# Patient Record
Sex: Female | Born: 1955 | Race: White | Hispanic: No | Marital: Married | Smoking: Never smoker
Health system: Southern US, Community
[De-identification: ages and names within clinical notes are randomized; demographics above are authoritative.]

## PROBLEM LIST (undated history)

## (undated) DIAGNOSIS — N3281 Overactive bladder: Secondary | ICD-10-CM

## (undated) DIAGNOSIS — Z803 Family history of malignant neoplasm of breast: Secondary | ICD-10-CM

## (undated) DIAGNOSIS — M1990 Unspecified osteoarthritis, unspecified site: Secondary | ICD-10-CM

## (undated) HISTORY — PX: BREAST LUMPECTOMY: SHX2

## (undated) HISTORY — PX: MOUTH SURGERY: SHX715

## (undated) HISTORY — DX: Family history of malignant neoplasm of breast: Z80.3

## (undated) HISTORY — PX: DILATION AND EVACUATION: SHX1459

## (undated) HISTORY — PX: TUBAL LIGATION: SHX77

---

## 1998-02-04 ENCOUNTER — Ambulatory Visit (HOSPITAL_COMMUNITY): Admission: RE | Admit: 1998-02-04 | Discharge: 1998-02-04 | Payer: Self-pay | Admitting: Obstetrics and Gynecology

## 1998-03-02 ENCOUNTER — Inpatient Hospital Stay (HOSPITAL_COMMUNITY): Admission: AD | Admit: 1998-03-02 | Discharge: 1998-03-05 | Payer: Self-pay | Admitting: Obstetrics & Gynecology

## 2000-01-04 ENCOUNTER — Other Ambulatory Visit: Admission: RE | Admit: 2000-01-04 | Discharge: 2000-01-04 | Payer: Self-pay | Admitting: Obstetrics & Gynecology

## 2002-09-24 ENCOUNTER — Other Ambulatory Visit: Admission: RE | Admit: 2002-09-24 | Discharge: 2002-09-24 | Payer: Self-pay | Admitting: Obstetrics & Gynecology

## 2003-09-29 ENCOUNTER — Other Ambulatory Visit: Admission: RE | Admit: 2003-09-29 | Discharge: 2003-09-29 | Payer: Self-pay | Admitting: Obstetrics & Gynecology

## 2004-11-11 ENCOUNTER — Other Ambulatory Visit: Admission: RE | Admit: 2004-11-11 | Discharge: 2004-11-11 | Payer: Self-pay | Admitting: Obstetrics & Gynecology

## 2018-12-12 ENCOUNTER — Other Ambulatory Visit: Payer: Self-pay | Admitting: Obstetrics & Gynecology

## 2018-12-12 DIAGNOSIS — R928 Other abnormal and inconclusive findings on diagnostic imaging of breast: Secondary | ICD-10-CM

## 2018-12-17 ENCOUNTER — Ambulatory Visit
Admission: RE | Admit: 2018-12-17 | Discharge: 2018-12-17 | Disposition: A | Discharge status: Home / Self Care | Payer: BLUE CROSS/BLUE SHIELD | Source: Ambulatory Visit | Attending: Obstetrics & Gynecology | Admitting: Obstetrics & Gynecology

## 2018-12-17 DIAGNOSIS — R928 Other abnormal and inconclusive findings on diagnostic imaging of breast: Secondary | ICD-10-CM

## 2020-01-10 IMAGING — US ULTRASOUND LEFT BREAST LIMITED
1 series · 6 of 6 positions shown · non-contrast
Comparison: Previous exam(s).

CLINICAL DATA: The patient was called back for left breast mass

EXAM:
DIGITAL DIAGNOSTIC LEFT MAMMOGRAM WITH TOMO
ULTRASOUND LEFT BREAST

[Series 1: ultrasound left breast limited · 0.07mm/px · 6 of 6 slices shown]
[im 1/6]
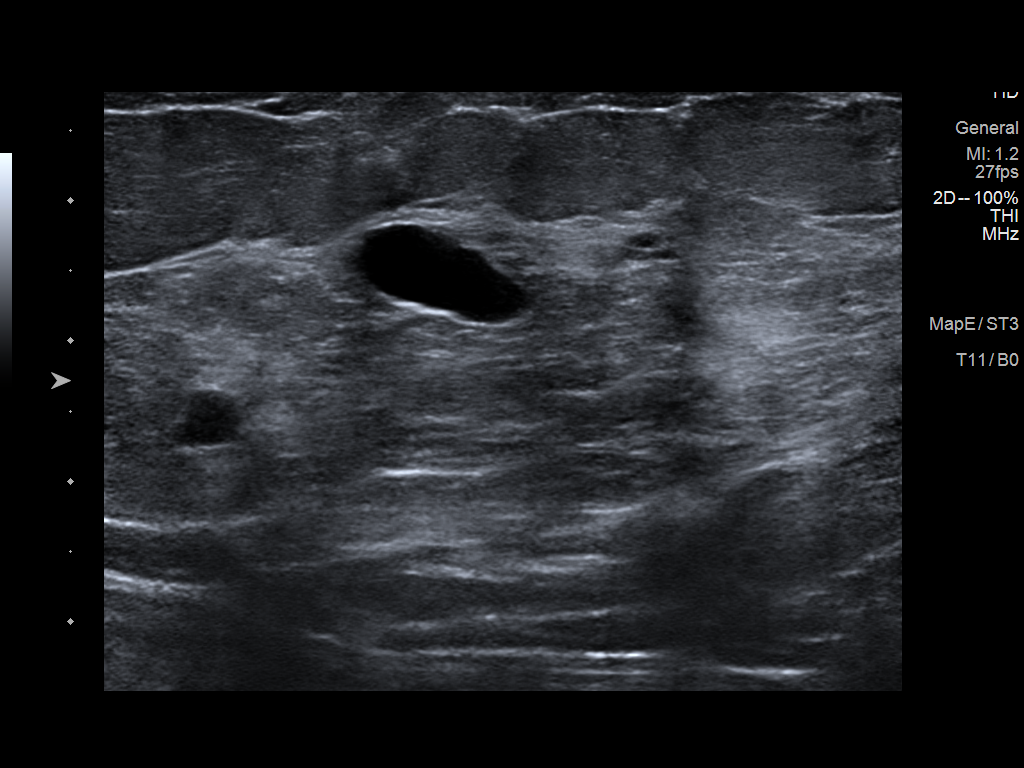
[im 2/6]
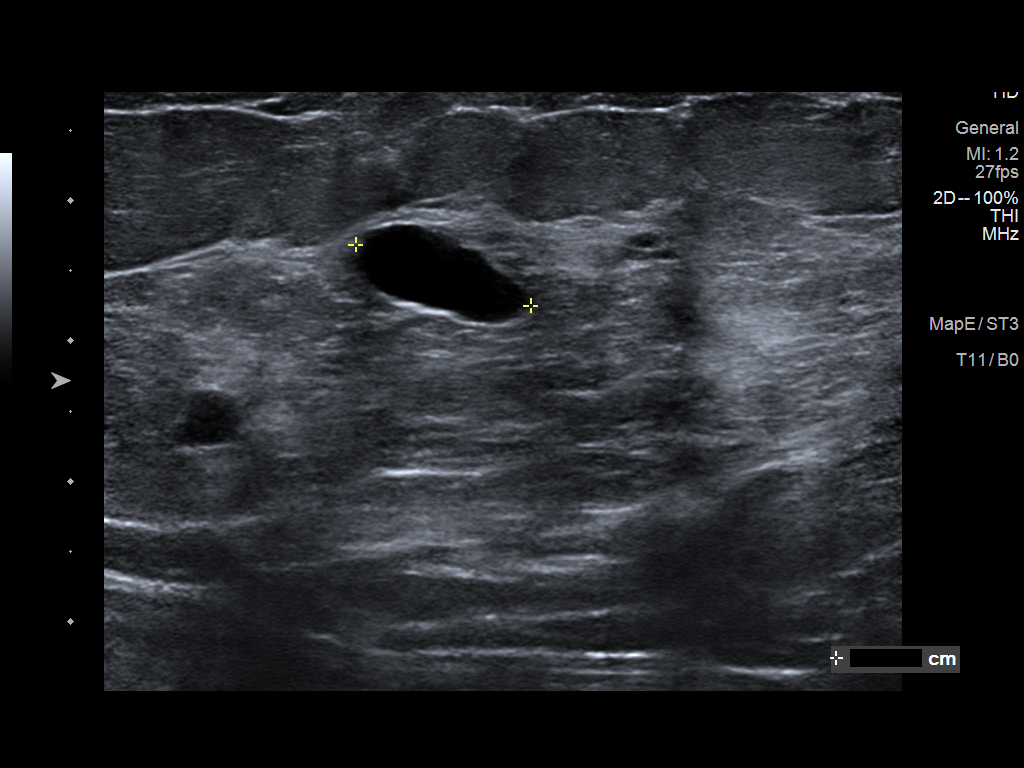
[im 3/6]
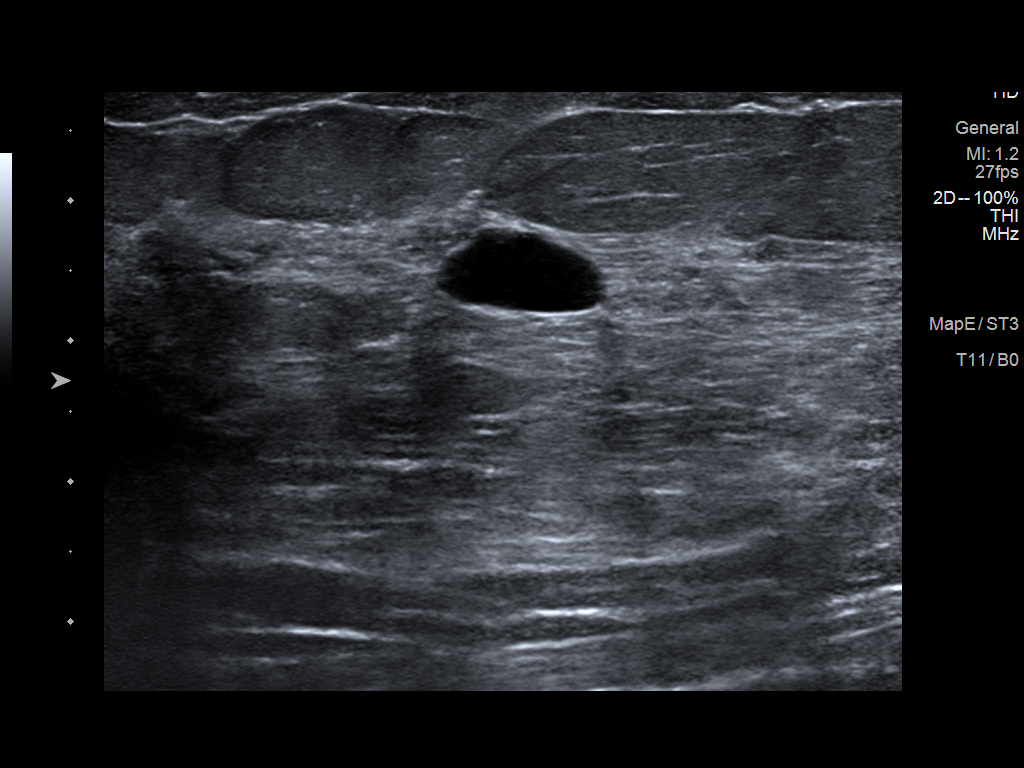
[im 4/6]
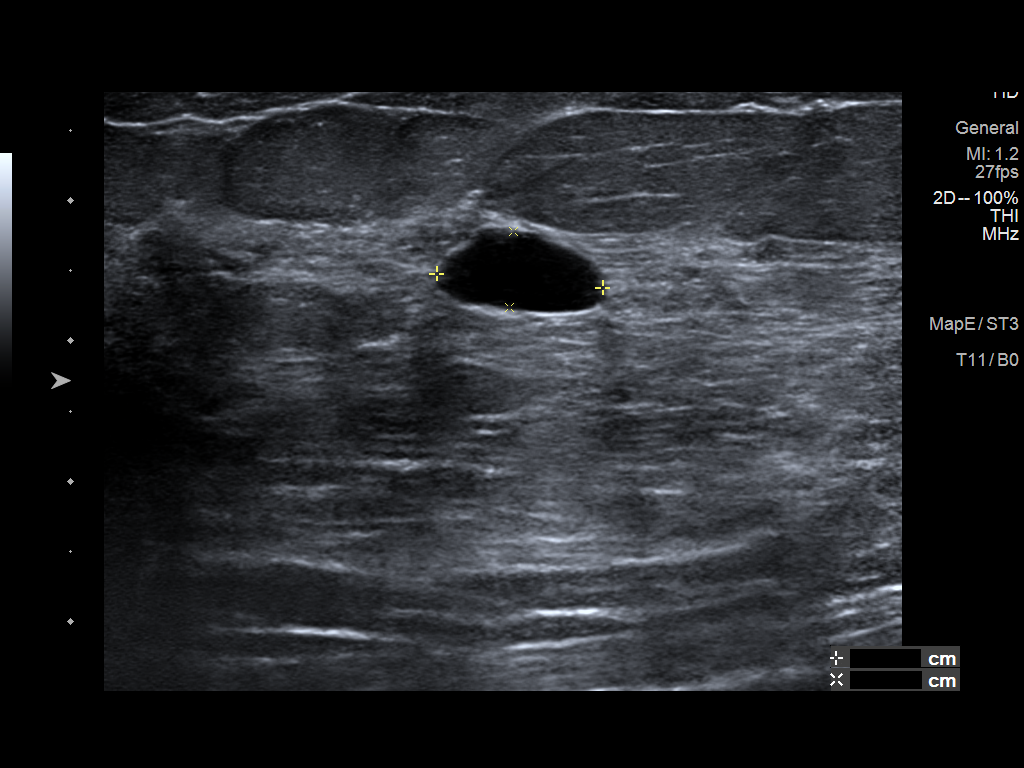
[im 5/6]
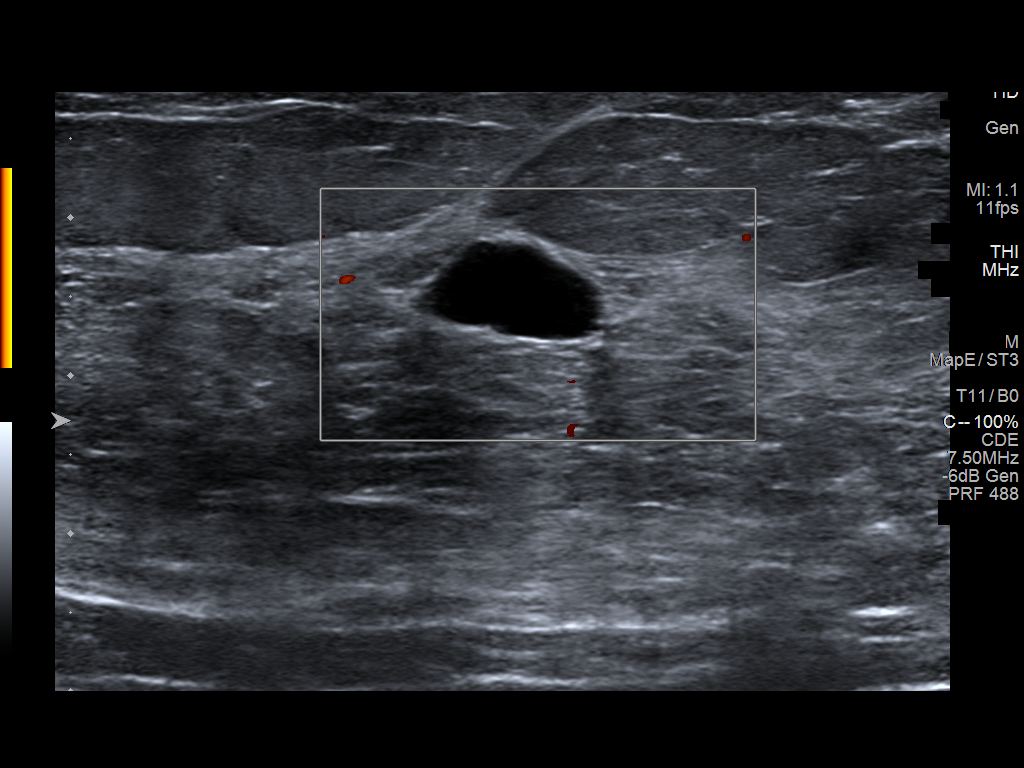
[im 6/6]
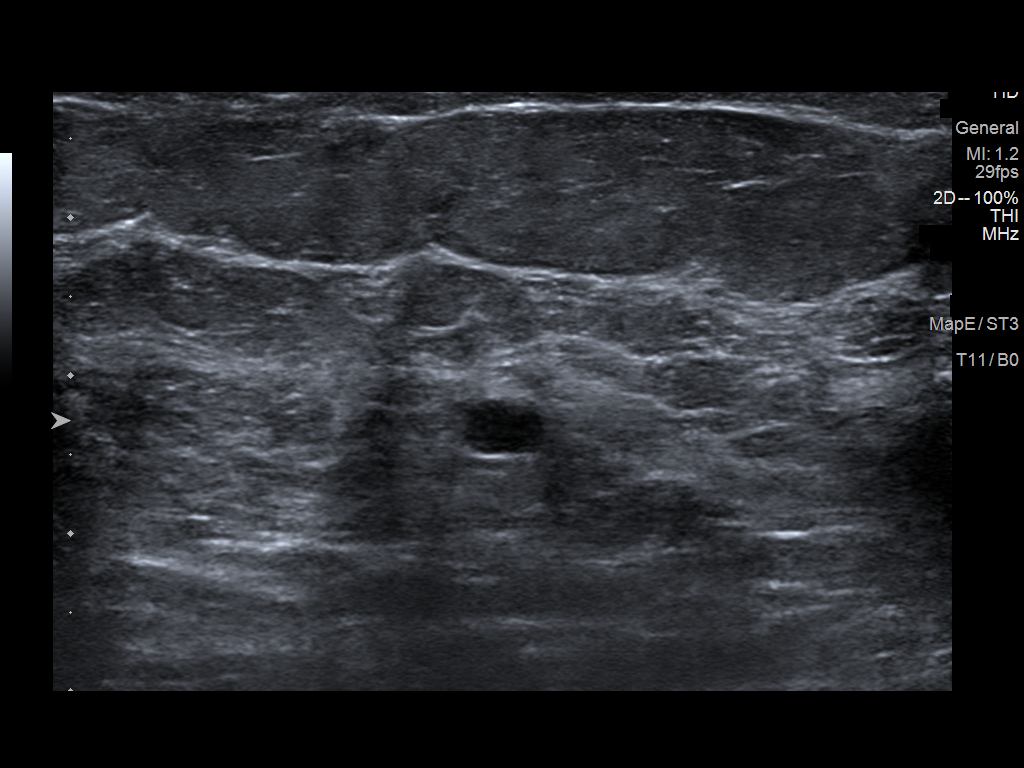

[6 of 6 positions shown; findings below may reference images not displayed]

ACR Breast Density Category b: There are scattered areas of
fibroglandular density.
FINDINGS: The mass in the slightly lateral superior left breast persists on
additional imaging.

On physical exam, no suspicious lumps are identified.

Targeted ultrasound is performed, showing a simple cyst in the upper
outer left breast accounting for the mammographic finding.
IMPRESSION: Fibrocystic changes.  No evidence of malignancy.

RECOMMENDATION:
Annual screening mammography.

I have discussed the findings and recommendations with the patient.
Results were also provided in writing at the conclusion of the
visit. If applicable, a reminder letter will be sent to the patient
regarding the next appointment.

BI-RADS CATEGORY  2: Benign.

## 2020-01-10 IMAGING — MG DIGITAL DIAGNOSTIC UNILATERAL LEFT MAMMOGRAM WITH TOMO AND CAD
4 series · 4 of 12 positions shown · non-contrast
Comparison: Previous exam(s).

CLINICAL DATA: The patient was called back for left breast mass

EXAM:
DIGITAL DIAGNOSTIC LEFT MAMMOGRAM WITH TOMO
ULTRASOUND LEFT BREAST

[L CC synth-2D]
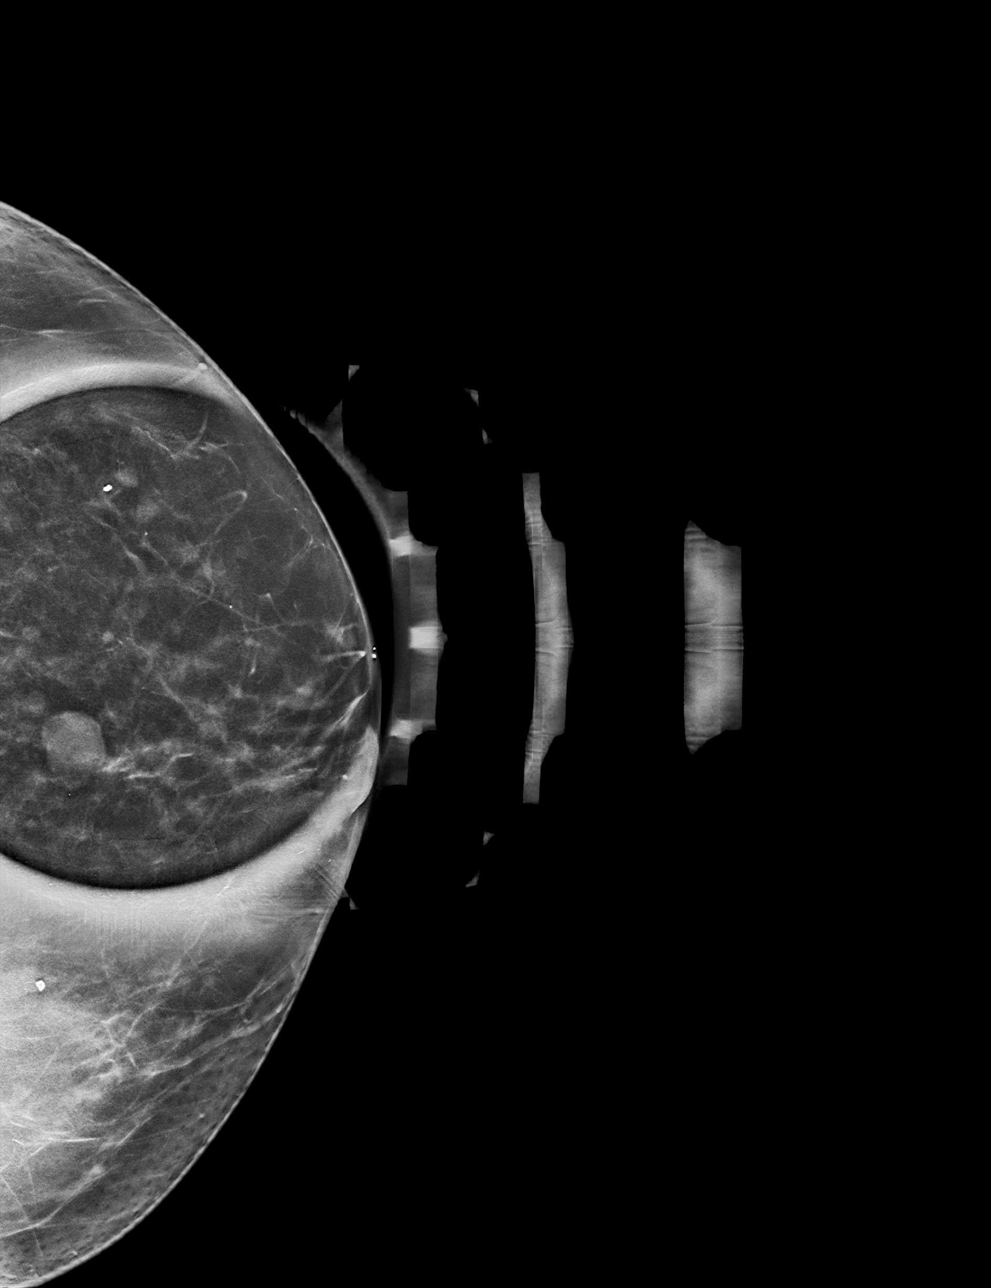

[L MLO synth-2D]
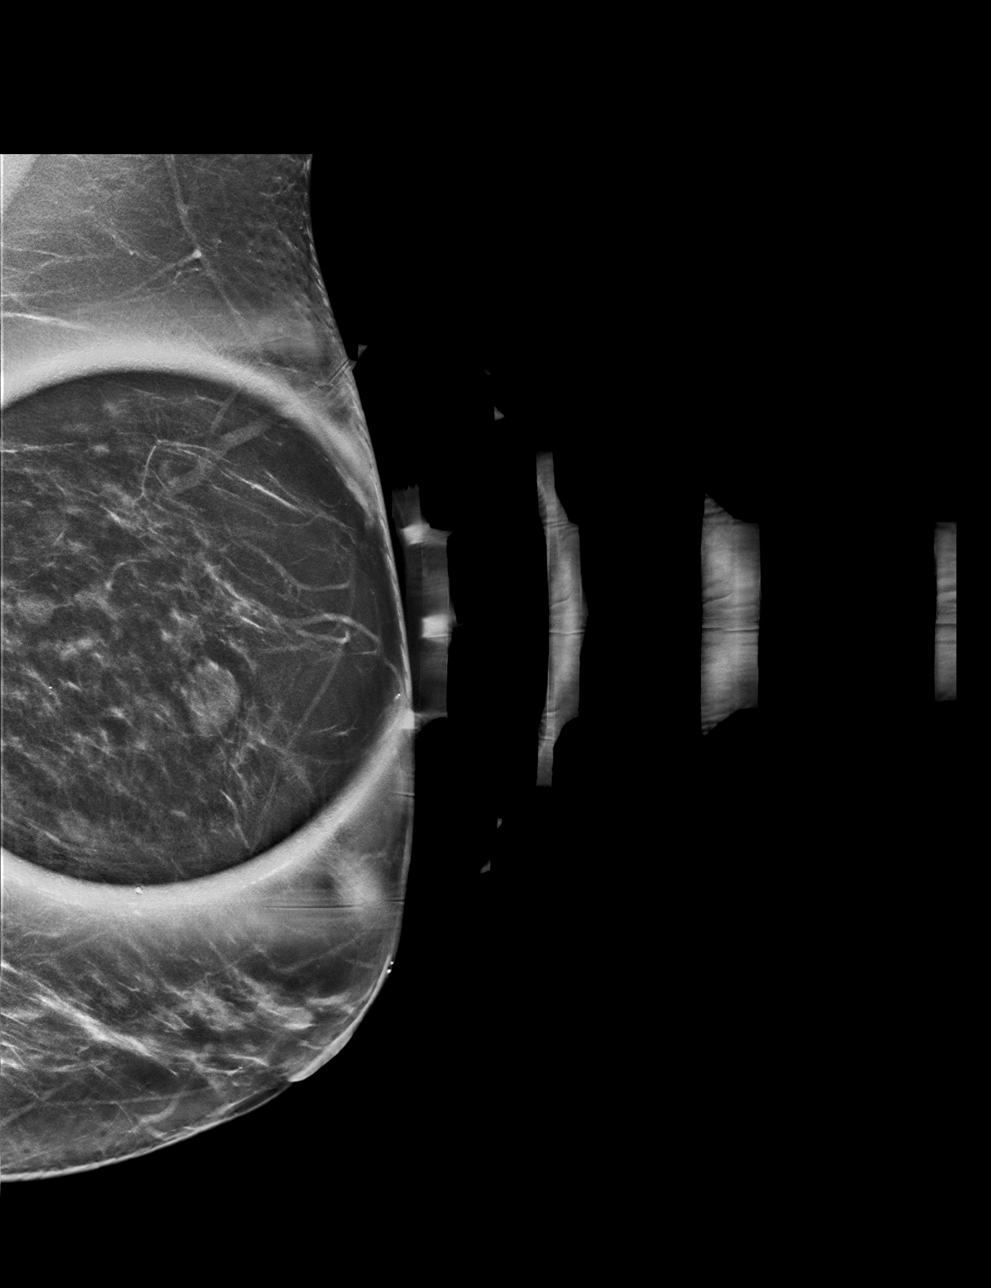

[L CC tomo · tomo slice 29/58.0]
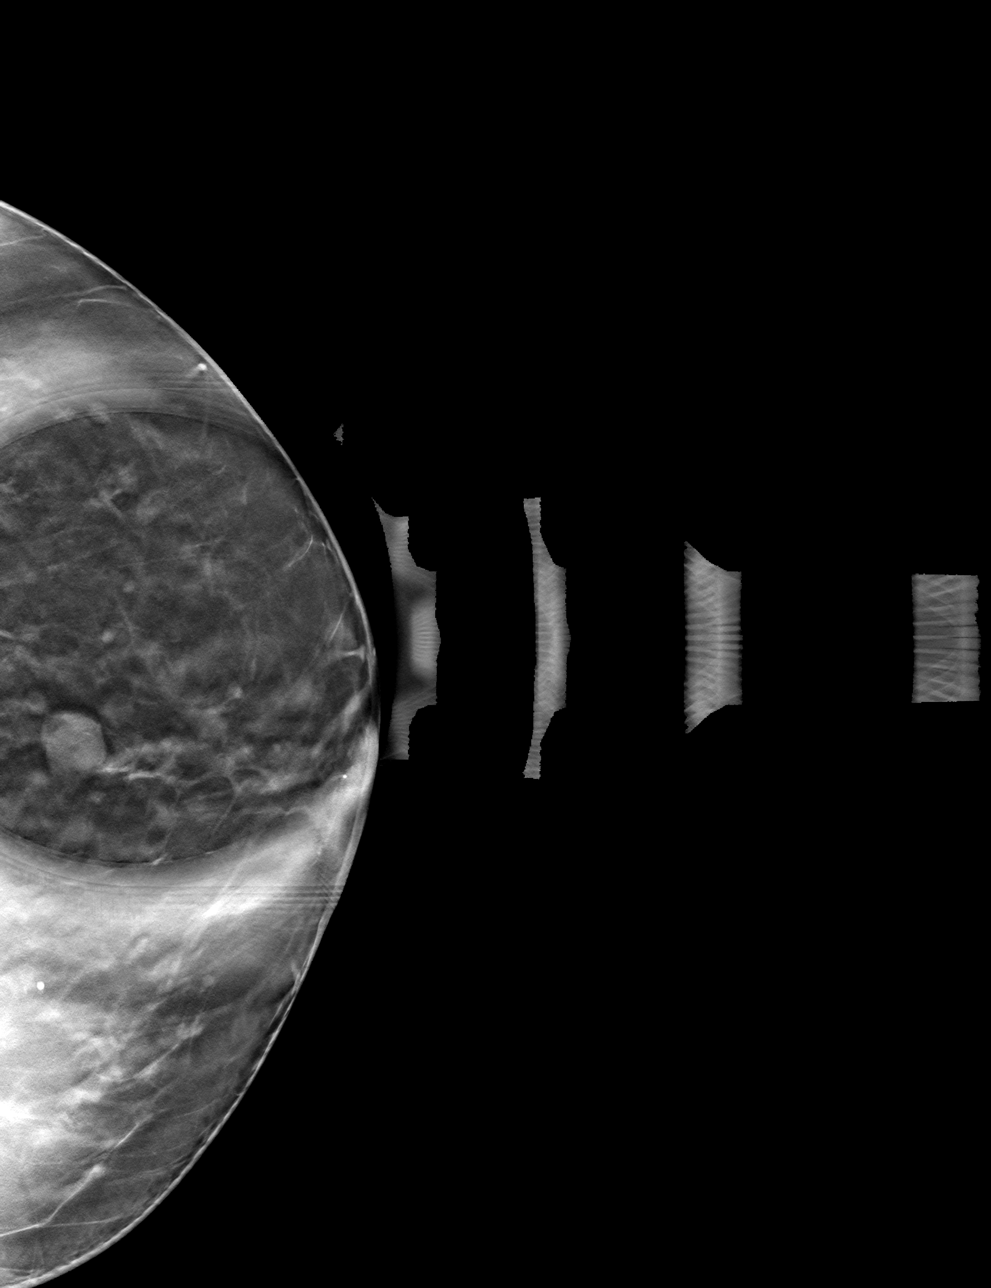

[L MLO tomo · tomo slice 35/70.0]
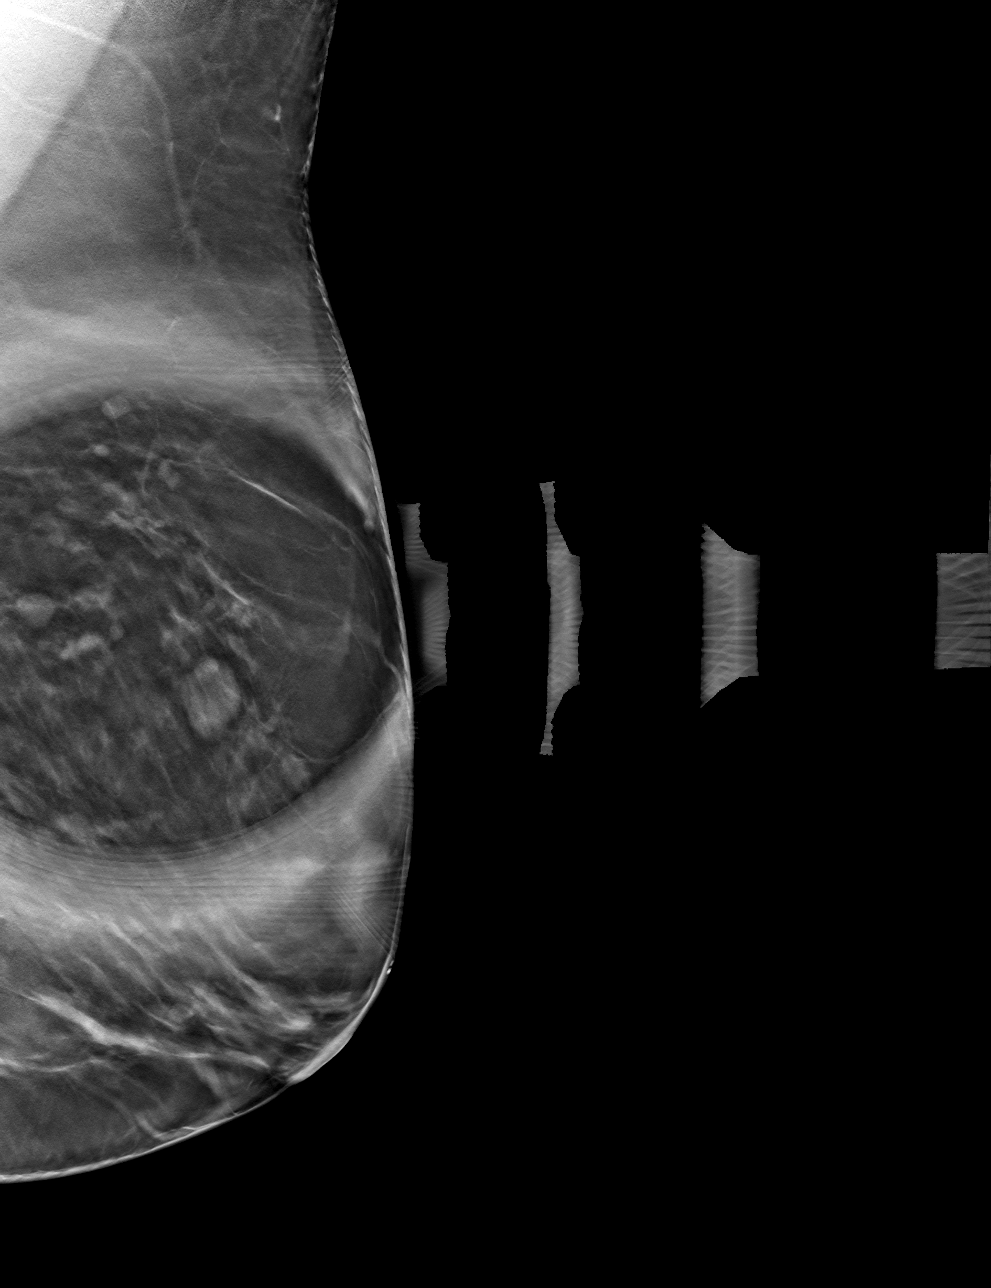

[4 of 12 positions shown; findings below may reference images not displayed]

ACR Breast Density Category b: There are scattered areas of
fibroglandular density.
FINDINGS: The mass in the slightly lateral superior left breast persists on
additional imaging.

On physical exam, no suspicious lumps are identified.

Targeted ultrasound is performed, showing a simple cyst in the upper
outer left breast accounting for the mammographic finding.
IMPRESSION: Fibrocystic changes.  No evidence of malignancy.

RECOMMENDATION:
Annual screening mammography.

I have discussed the findings and recommendations with the patient.
Results were also provided in writing at the conclusion of the
visit. If applicable, a reminder letter will be sent to the patient
regarding the next appointment.

BI-RADS CATEGORY  2: Benign.

## 2020-11-20 HISTORY — PX: BREAST BIOPSY: SHX20

## 2021-05-19 ENCOUNTER — Other Ambulatory Visit: Payer: Self-pay | Admitting: Obstetrics & Gynecology

## 2021-05-19 DIAGNOSIS — R928 Other abnormal and inconclusive findings on diagnostic imaging of breast: Secondary | ICD-10-CM

## 2021-05-20 ENCOUNTER — Other Ambulatory Visit: Payer: Self-pay

## 2021-05-20 ENCOUNTER — Ambulatory Visit
Admission: RE | Admit: 2021-05-20 | Discharge: 2021-05-20 | Disposition: A | Discharge status: Home / Self Care | Payer: BC Managed Care – PPO | Source: Ambulatory Visit | Attending: Obstetrics & Gynecology | Admitting: Obstetrics & Gynecology

## 2021-05-20 ENCOUNTER — Other Ambulatory Visit: Payer: Self-pay | Admitting: Obstetrics & Gynecology

## 2021-05-20 DIAGNOSIS — R928 Other abnormal and inconclusive findings on diagnostic imaging of breast: Secondary | ICD-10-CM

## 2021-05-20 DIAGNOSIS — R921 Mammographic calcification found on diagnostic imaging of breast: Secondary | ICD-10-CM

## 2021-05-26 ENCOUNTER — Ambulatory Visit
Admission: RE | Admit: 2021-05-26 | Discharge: 2021-05-26 | Disposition: A | Discharge status: Home / Self Care | Payer: BC Managed Care – PPO | Source: Ambulatory Visit | Attending: Obstetrics & Gynecology | Admitting: Obstetrics & Gynecology

## 2021-05-26 ENCOUNTER — Other Ambulatory Visit: Payer: Self-pay

## 2021-05-26 ENCOUNTER — Other Ambulatory Visit: Payer: Self-pay | Admitting: Obstetrics & Gynecology

## 2021-05-26 DIAGNOSIS — R921 Mammographic calcification found on diagnostic imaging of breast: Secondary | ICD-10-CM

## 2021-07-04 DIAGNOSIS — N6081 Other benign mammary dysplasias of right breast: Secondary | ICD-10-CM

## 2021-07-04 HISTORY — DX: Other benign mammary dysplasias of right breast: N60.81

## 2021-12-02 ENCOUNTER — Other Ambulatory Visit: Payer: Self-pay | Admitting: Obstetrics & Gynecology

## 2021-12-02 DIAGNOSIS — R928 Other abnormal and inconclusive findings on diagnostic imaging of breast: Secondary | ICD-10-CM

## 2021-12-13 ENCOUNTER — Ambulatory Visit
Admission: RE | Admit: 2021-12-13 | Discharge: 2021-12-13 | Disposition: A | Discharge status: Home / Self Care | Payer: BC Managed Care – PPO | Source: Ambulatory Visit | Attending: Obstetrics & Gynecology | Admitting: Obstetrics & Gynecology

## 2021-12-13 ENCOUNTER — Other Ambulatory Visit: Payer: Self-pay | Admitting: Obstetrics & Gynecology

## 2021-12-13 DIAGNOSIS — R921 Mammographic calcification found on diagnostic imaging of breast: Secondary | ICD-10-CM

## 2021-12-13 DIAGNOSIS — R928 Other abnormal and inconclusive findings on diagnostic imaging of breast: Secondary | ICD-10-CM

## 2022-06-13 ENCOUNTER — Ambulatory Visit
Admission: RE | Admit: 2022-06-13 | Discharge: 2022-06-13 | Disposition: A | Discharge status: Home / Self Care | Payer: BC Managed Care – PPO | Source: Ambulatory Visit | Attending: Obstetrics & Gynecology | Admitting: Obstetrics & Gynecology

## 2022-06-13 ENCOUNTER — Other Ambulatory Visit: Payer: Self-pay | Admitting: Obstetrics & Gynecology

## 2022-06-13 DIAGNOSIS — Z87898 Personal history of other specified conditions: Secondary | ICD-10-CM

## 2022-06-13 DIAGNOSIS — R921 Mammographic calcification found on diagnostic imaging of breast: Secondary | ICD-10-CM

## 2022-08-23 ENCOUNTER — Other Ambulatory Visit: Payer: Self-pay | Admitting: Obstetrics & Gynecology

## 2022-08-24 ENCOUNTER — Other Ambulatory Visit: Payer: Self-pay | Admitting: Obstetrics & Gynecology

## 2022-08-24 DIAGNOSIS — N6452 Nipple discharge: Secondary | ICD-10-CM

## 2022-09-14 ENCOUNTER — Ambulatory Visit
Admission: RE | Admit: 2022-09-14 | Discharge: 2022-09-14 | Disposition: A | Discharge status: Home / Self Care | Payer: Medicare Other | Source: Ambulatory Visit | Attending: Obstetrics & Gynecology | Admitting: Obstetrics & Gynecology

## 2022-09-14 DIAGNOSIS — N6452 Nipple discharge: Secondary | ICD-10-CM

## 2022-09-14 MED ORDER — GADOPICLENOL 0.5 MMOL/ML IV SOLN
8.0000 mL | Freq: Once | INTRAVENOUS | Status: AC | PRN
  Administered 2022-09-14: 8 mL via INTRAVENOUS

## 2022-09-15 ENCOUNTER — Other Ambulatory Visit: Payer: Self-pay | Admitting: Obstetrics & Gynecology

## 2022-09-15 DIAGNOSIS — R928 Other abnormal and inconclusive findings on diagnostic imaging of breast: Secondary | ICD-10-CM

## 2022-09-28 ENCOUNTER — Ambulatory Visit
Admission: RE | Admit: 2022-09-28 | Discharge: 2022-09-28 | Disposition: A | Discharge status: Home / Self Care | Payer: Medicare Other | Source: Ambulatory Visit | Attending: Obstetrics & Gynecology | Admitting: Obstetrics & Gynecology

## 2022-09-28 DIAGNOSIS — R928 Other abnormal and inconclusive findings on diagnostic imaging of breast: Secondary | ICD-10-CM

## 2022-11-14 ENCOUNTER — Other Ambulatory Visit: Payer: Self-pay

## 2022-11-14 ENCOUNTER — Encounter (HOSPITAL_BASED_OUTPATIENT_CLINIC_OR_DEPARTMENT_OTHER): Payer: Self-pay | Admitting: General Surgery

## 2022-11-22 ENCOUNTER — Other Ambulatory Visit: Payer: Self-pay | Admitting: General Surgery

## 2022-11-23 ENCOUNTER — Ambulatory Visit (HOSPITAL_BASED_OUTPATIENT_CLINIC_OR_DEPARTMENT_OTHER): Payer: Medicare Other | Admitting: Certified Registered Nurse Anesthetist

## 2022-11-23 ENCOUNTER — Other Ambulatory Visit: Payer: Self-pay

## 2022-11-23 ENCOUNTER — Encounter (HOSPITAL_BASED_OUTPATIENT_CLINIC_OR_DEPARTMENT_OTHER): Payer: Self-pay | Admitting: General Surgery

## 2022-11-23 ENCOUNTER — Ambulatory Visit (HOSPITAL_BASED_OUTPATIENT_CLINIC_OR_DEPARTMENT_OTHER)
Admission: RE | Admit: 2022-11-23 | Discharge: 2022-11-23 | Disposition: A | Discharge status: Home / Self Care | Payer: Medicare Other | Attending: General Surgery | Admitting: General Surgery

## 2022-11-23 ENCOUNTER — Encounter (HOSPITAL_BASED_OUTPATIENT_CLINIC_OR_DEPARTMENT_OTHER)
Admission: RE | Disposition: A | Discharge status: Home / Self Care | Payer: Self-pay | Source: Home / Self Care | Attending: General Surgery

## 2022-11-23 DIAGNOSIS — M1990 Unspecified osteoarthritis, unspecified site: Secondary | ICD-10-CM | POA: Diagnosis not present

## 2022-11-23 DIAGNOSIS — D0511 Intraductal carcinoma in situ of right breast: Secondary | ICD-10-CM

## 2022-11-23 DIAGNOSIS — N6310 Unspecified lump in the right breast, unspecified quadrant: Secondary | ICD-10-CM | POA: Diagnosis present

## 2022-11-23 HISTORY — DX: Unspecified osteoarthritis, unspecified site: M19.90

## 2022-11-23 HISTORY — PX: MASS EXCISION: SHX2000

## 2022-11-23 HISTORY — DX: Overactive bladder: N32.81

## 2022-11-23 SURGERY — EXCISION MASS
Anesthesia: General | Site: Breast | Laterality: Right

## 2022-11-23 MED ORDER — ENSURE PRE-SURGERY PO LIQD: 296.0000 mL | Freq: Once | ORAL | Status: DC

## 2022-11-23 MED ORDER — PROPOFOL 10 MG/ML IV BOLUS
INTRAVENOUS | Status: DC | PRN
  Administered 2022-11-23: 200 mg via INTRAVENOUS

## 2022-11-23 MED ORDER — CEFAZOLIN SODIUM-DEXTROSE 2-4 GM/100ML-% IV SOLN
INTRAVENOUS | Status: AC
  Filled 2022-11-23: qty 100

## 2022-11-23 MED ORDER — MIDAZOLAM HCL 5 MG/5ML IJ SOLN
INTRAMUSCULAR | Status: DC | PRN
  Administered 2022-11-23: 2 mg via INTRAVENOUS

## 2022-11-23 MED ORDER — AMISULPRIDE (ANTIEMETIC) 5 MG/2ML IV SOLN: 10.0000 mg | Freq: Once | INTRAVENOUS | Status: DC | PRN

## 2022-11-23 MED ORDER — OXYCODONE HCL 5 MG PO TABS: 5.0000 mg | ORAL_TABLET | Freq: Once | ORAL | Status: DC | PRN

## 2022-11-23 MED ORDER — DEXAMETHASONE SODIUM PHOSPHATE 4 MG/ML IJ SOLN
INTRAMUSCULAR | Status: DC | PRN
  Administered 2022-11-23: 5 mg via INTRAVENOUS

## 2022-11-23 MED ORDER — CHLORHEXIDINE GLUCONATE CLOTH 2 % EX PADS: 6.0000 | MEDICATED_PAD | Freq: Once | CUTANEOUS | Status: DC

## 2022-11-23 MED ORDER — KETOROLAC TROMETHAMINE 30 MG/ML IJ SOLN
INTRAMUSCULAR | Status: AC
  Filled 2022-11-23: qty 1

## 2022-11-23 MED ORDER — MIDAZOLAM HCL 2 MG/2ML IJ SOLN
INTRAMUSCULAR | Status: AC
  Filled 2022-11-23: qty 2

## 2022-11-23 MED ORDER — LIDOCAINE 2% (20 MG/ML) 5 ML SYRINGE
INTRAMUSCULAR | Status: AC
  Filled 2022-11-23: qty 5

## 2022-11-23 MED ORDER — CEFAZOLIN SODIUM-DEXTROSE 2-4 GM/100ML-% IV SOLN
2.0000 g | INTRAVENOUS | Status: AC
  Administered 2022-11-23: 2 g via INTRAVENOUS

## 2022-11-23 MED ORDER — EPHEDRINE SULFATE (PRESSORS) 50 MG/ML IJ SOLN
INTRAMUSCULAR | Status: DC | PRN
  Administered 2022-11-23: 10 mg via INTRAVENOUS

## 2022-11-23 MED ORDER — HYDROMORPHONE HCL 1 MG/ML IJ SOLN: 0.2500 mg | INTRAMUSCULAR | Status: DC | PRN

## 2022-11-23 MED ORDER — OXYCODONE HCL 5 MG/5ML PO SOLN: 5.0000 mg | Freq: Once | ORAL | Status: DC | PRN

## 2022-11-23 MED ORDER — ONDANSETRON HCL 4 MG/2ML IJ SOLN
INTRAMUSCULAR | Status: DC | PRN
  Administered 2022-11-23: 4 mg via INTRAVENOUS

## 2022-11-23 MED ORDER — KETOROLAC TROMETHAMINE 30 MG/ML IJ SOLN
INTRAMUSCULAR | Status: DC | PRN
  Administered 2022-11-23: 30 mg via INTRAVENOUS

## 2022-11-23 MED ORDER — ACETAMINOPHEN 500 MG PO TABS
ORAL_TABLET | ORAL | Status: AC
  Filled 2022-11-23: qty 2

## 2022-11-23 MED ORDER — FENTANYL CITRATE (PF) 100 MCG/2ML IJ SOLN
INTRAMUSCULAR | Status: DC | PRN
  Administered 2022-11-23: 50 ug via INTRAVENOUS

## 2022-11-23 MED ORDER — LACTATED RINGERS IV SOLN: INTRAVENOUS | Status: DC

## 2022-11-23 MED ORDER — DEXAMETHASONE SODIUM PHOSPHATE 10 MG/ML IJ SOLN
INTRAMUSCULAR | Status: AC
  Filled 2022-11-23: qty 1

## 2022-11-23 MED ORDER — ACETAMINOPHEN 500 MG PO TABS
1000.0000 mg | ORAL_TABLET | ORAL | Status: AC
  Administered 2022-11-23: 1000 mg via ORAL

## 2022-11-23 MED ORDER — LIDOCAINE HCL (CARDIAC) PF 100 MG/5ML IV SOSY
PREFILLED_SYRINGE | INTRAVENOUS | Status: DC | PRN
  Administered 2022-11-23: 50 mg via INTRAVENOUS

## 2022-11-23 MED ORDER — FENTANYL CITRATE (PF) 100 MCG/2ML IJ SOLN
INTRAMUSCULAR | Status: AC
  Filled 2022-11-23: qty 2

## 2022-11-23 MED ORDER — MEPERIDINE HCL 25 MG/ML IJ SOLN: 6.2500 mg | INTRAMUSCULAR | Status: DC | PRN

## 2022-11-23 MED ORDER — ONDANSETRON HCL 4 MG/2ML IJ SOLN
INTRAMUSCULAR | Status: AC
  Filled 2022-11-23: qty 2

## 2022-11-23 MED ORDER — BUPIVACAINE HCL (PF) 0.25 % IJ SOLN
INTRAMUSCULAR | Status: DC | PRN
  Administered 2022-11-23: 10 mL

## 2022-11-23 SURGICAL SUPPLY — 40 items
ADH SKN CLS APL DERMABOND .7 (GAUZE/BANDAGES/DRESSINGS) ×1
APL PRP STRL LF DISP 70% ISPRP (MISCELLANEOUS) ×1
BLADE SURG 15 STRL LF DISP TIS (BLADE) ×1 IMPLANT
BLADE SURG 15 STRL SS (BLADE) ×1
CHLORAPREP W/TINT 26 (MISCELLANEOUS) ×1 IMPLANT
CLSR STERI-STRIP ANTIMIC 1/2X4 (GAUZE/BANDAGES/DRESSINGS) ×1 IMPLANT
COVER BACK TABLE 60X90IN (DRAPES) ×1 IMPLANT
COVER MAYO STAND STRL (DRAPES) ×1 IMPLANT
DERMABOND ADVANCED .7 DNX12 (GAUZE/BANDAGES/DRESSINGS) ×1 IMPLANT
DRAPE LAPAROTOMY 100X72 PEDS (DRAPES) ×1 IMPLANT
DRAPE UTILITY XL STRL (DRAPES) ×1 IMPLANT
ELECT COATED BLADE 2.86 ST (ELECTRODE) IMPLANT
ELECT REM PT RETURN 9FT ADLT (ELECTROSURGICAL) ×1
ELECTRODE REM PT RTRN 9FT ADLT (ELECTROSURGICAL) ×1 IMPLANT
GLOVE BIO SURGEON STRL SZ7 (GLOVE) ×1 IMPLANT
GLOVE BIOGEL PI IND STRL 6.5 (GLOVE) IMPLANT
GLOVE BIOGEL PI IND STRL 7.0 (GLOVE) IMPLANT
GLOVE BIOGEL PI IND STRL 7.5 (GLOVE) ×1 IMPLANT
GLOVE SURG SS PI 6.5 STRL IVOR (GLOVE) IMPLANT
GOWN STRL REUS W/ TWL LRG LVL3 (GOWN DISPOSABLE) ×3 IMPLANT
GOWN STRL REUS W/TWL LRG LVL3 (GOWN DISPOSABLE) ×3
NDL HYPO 25X1 1.5 SAFETY (NEEDLE) ×1 IMPLANT
NEEDLE HYPO 25X1 1.5 SAFETY (NEEDLE) ×1 IMPLANT
NS IRRIG 1000ML POUR BTL (IV SOLUTION) IMPLANT
PACK BASIN DAY SURGERY FS (CUSTOM PROCEDURE TRAY) ×1 IMPLANT
PENCIL SMOKE EVACUATOR (MISCELLANEOUS) ×1 IMPLANT
SLEEVE SCD COMPRESS KNEE MED (STOCKING) ×1 IMPLANT
SPONGE T-LAP 4X18 ~~LOC~~+RFID (SPONGE) ×1 IMPLANT
SUT MNCRL AB 4-0 PS2 18 (SUTURE) ×1 IMPLANT
SUT MON AB 5-0 PS2 18 (SUTURE) IMPLANT
SUT SILK 2 0 SH (SUTURE) IMPLANT
SUT VIC AB 2-0 SH 27 (SUTURE) ×1
SUT VIC AB 2-0 SH 27XBRD (SUTURE) IMPLANT
SUT VIC AB 3-0 SH 27 (SUTURE) ×1
SUT VIC AB 3-0 SH 27X BRD (SUTURE) IMPLANT
SUT VICRYL 3-0 CR8 SH (SUTURE) IMPLANT
SUT VICRYL 4-0 PS2 18IN ABS (SUTURE) IMPLANT
SYR CONTROL 10ML LL (SYRINGE) ×1 IMPLANT
TOWEL GREEN STERILE FF (TOWEL DISPOSABLE) ×1 IMPLANT
UNDERPAD 30X36 HEAVY ABSORB (UNDERPADS AND DIAPERS) IMPLANT

## 2022-11-23 NOTE — Op Note (Signed)
  Preoperative diagnosis: right nipple mass Postoperative diagnosis: Same as above Procedure: Right nipple mass excision Surgeon: Dr. Serita Grammes Anesthesia: General Estimated blood loss: Minimal Specimens: Right nipple mass, additional medial margin marked short superior, long lateral, double deep Complications: None Drains: None Sponge needle count was correct completion Disposition to recovery in stable condition   Indications: 11 yof I saw 8/22 with noprior breast history. She has no family history of breast or ovarian cancer. She underwent a screening mammogram that shows B density breast. The left side was negative. On the right side she has several groups of calcifications in the upper outer and lateral right breast. There are 2 adjacent groups measuring 1 cm and 1.4 cm. 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 We elected to follow this at that time.  However she noted some blood in her bra from the right side. This was evaluated with mm/us and was negative. Due to discharge Dr Stann Mainland sent her for MRI. This shows b density breast tissue. It showed an oval mass within the right nipple and extending posteriorly measuring 9x4 mm in size. There is biopsy clip artifact at site of prior biopsy with no abnormality noted. The left breast is negative. The nodes show a prominent right level 1 ax node. [place] was done to confirm the mass involving the nipple and the right axilla was negative. We discussed excision.  Procedure: After informed consent was obtained the patient was taken to the operating room.  She was given antibiotics.  SCDs were in place.  She was placed under general anesthesia without complication.  She was prepped and draped in the standard sterile surgical fashion.  Surgical timeout was then performed.    I infiltrated Marcaine.  I made a periareolar incision in order to hide the scar later.  I then  dissected to the nipple.  I was able to invert the nipple and remove the entire undersurface of the nipple including the palpable mass.  I did not make a hole in the nipple doing this.  There is no mass that is present at this point now.  I did remove the tissue posterior to this as well.  This was then marked and passed off the table.  This was sent to pathology.  I then obtained hemostasis.  I closed the breast tissue with 2-0 Vicryl.  The skin was closed with 3-0 Vicryl and 5-0 Monocryl.  Glue and Steri-Strips were applied.  She tolerated this well was extubated and transferred to recovery stable.

## 2022-11-23 NOTE — Anesthesia Postprocedure Evaluation (Signed)
Anesthesia Post Note  Patient: Susan Stevens  Procedure(s) Performed: EXCISION OF RIGHT NIPPLE MASS (Right: Breast)     Patient location during evaluation: PACU Anesthesia Type: General Level of consciousness: awake and alert Pain management: pain level controlled Vital Signs Assessment: post-procedure vital signs reviewed and stable Respiratory status: spontaneous breathing, nonlabored ventilation and respiratory function stable Cardiovascular status: blood pressure returned to baseline and stable Postop Assessment: no apparent nausea or vomiting Anesthetic complications: no   No notable events documented.  Last Vitals:  Vitals:   11/23/22 1000 11/23/22 1038  BP: 95/62 123/77  Pulse: 92 81  Resp: 19 20  Temp:  (!) 36.3 C  SpO2: 95% 97%    Last Pain:  Vitals:   11/23/22 1038  TempSrc: Tympanic  PainSc: 0-No pain                 Lynda Rainwater

## 2022-11-23 NOTE — Interval H&P Note (Signed)
History and Physical Interval Note:  11/23/2022 8:36 AM  Susan Stevens  has presented today for surgery, with the diagnosis of RIGHT NIPPLE MASS.  The various methods of treatment have been discussed with the patient and family. After consideration of risks, benefits and other options for treatment, the patient has consented to  Procedure(s): EXCISION OR RIGHT NIPPLE MASS (Right) as a surgical intervention.  The patient's history has been reviewed, patient examined, no change in status, stable for surgery.  I have reviewed the patient's chart and labs.  Questions were answered to the patient's satisfaction.     Rolm Bookbinder

## 2022-11-23 NOTE — Discharge Instructions (Addendum)
[place] Office Phone Number [phone]  POST OP INSTRUCTIONS Take 400 mg of ibuprofen every 8 hours or 650 mg tylenol every 6 hours for next 72 hours then as needed. Use ice several times daily also.  A prescription for pain medication may be given to you upon discharge.  Take your pain medication as prescribed, if needed.  If narcotic pain medicine is not needed, then you may take acetaminophen (Tylenol), naprosyn (Alleve) or ibuprofen (Advil) as needed.Next tylenol dose 2pm.  Take your usually prescribed medications unless otherwise directed If you need a refill on your pain medication, please contact your pharmacy.  They will contact our office to request authorization.  Prescriptions will not be filled after 5pm or on week-ends. You should eat very light the first 24 hours after surgery, such as soup, crackers, pudding, etc.  Resume your normal diet the day after surgery. Most patients will experience some swelling and bruising in the breast.  Ice packs and a good support bra will help.  Wear the breast binder provided or a sports bra for 72 hours day and night.  After that wear a sports bra during the day until you return to the office. Swelling and bruising can take several days to resolve.  It is common to experience some constipation if taking pain medication after surgery.  Increasing fluid intake and taking a stool softener will usually help or prevent this problem from occurring.  A mild laxative (Milk of Magnesia or Miralax) should be taken according to package directions if there are no bowel movements after 48 hours. I used skin glue on the incision, you may shower in 24 hours.  The glue will flake off over the next 2-3 weeks.  Any sutures or staples will be removed at the office during your follow-up visit. ACTIVITIES:  You may resume regular daily activities (gradually increasing) beginning the next day.  Wearing a good support bra or sports bra minimizes pain and  swelling.  You may have sexual intercourse when it is comfortable. You may drive when you no longer are taking prescription pain medication, you can comfortably wear a seatbelt, and you can safely maneuver your car and apply brakes. RETURN TO WORK:  ______________________________________________________________________________________ Dennis Bast should see your doctor in the office for a follow-up appointment approximately two weeks after your surgery.  Your doctor's nurse will typically make your follow-up appointment when she calls you with your pathology report.  Expect your pathology report 3-4 business days after your surgery.  You may call to check if you do not hear from [place] after three days. OTHER INSTRUCTIONS: _______________________________________________________________________________________________ _____________________________________________________________________________________________________________________________________ _____________________________________________________________________________________________________________________________________ _____________________________________________________________________________________________________________________________________  WHEN TO CALL DR WAKEFIELD: Fever over 101.0 Nausea and/or vomiting. Extreme swelling or bruising. Continued bleeding from incision. Increased pain, redness, or drainage from the incision.  Post Anesthesia Home Care Instructions  Activity: Get plenty of rest for the remainder of the day. A responsible individual must stay with you for 24 hours following the procedure.  For the next 24 hours, DO NOT: -Drive a car -Paediatric nurse -Drink alcoholic beverages -Take any medication unless instructed by your physician -Make any legal decisions or sign important papers.  Meals: Start with liquid foods such as gelatin or soup. Progress to regular foods as tolerated. Avoid greasy, spicy, heavy foods.  If nausea and/or vomiting occur, drink only clear liquids until the nausea and/or vomiting subsides. Call your physician if vomiting continues.  Special Instructions/Symptoms: Your throat may feel dry or sore from the anesthesia or  the breathing tube placed in your throat during surgery. If this causes discomfort, gargle with warm salt water. The discomfort should disappear within 24 hours.  If you had a scopolamine patch placed behind your ear for the management of post- operative nausea and/or vomiting:  1. The medication in the patch is effective for 72 hours, after which it should be removed.  Wrap patch in a tissue and discard in the trash. Wash hands thoroughly with soap and water. 2. You may remove the patch earlier than 72 hours if you experience unpleasant side effects which may include dry mouth, dizziness or visual disturbances. 3. Avoid touching the patch. Wash your hands with soap and water after contact with the patch.    Post Anesthesia Home Care Instructions  Activity: Get plenty of rest for the remainder of the day. A responsible individual must stay with you for 24 hours following the procedure.  For the next 24 hours, DO NOT: -Drive a car -Paediatric nurse -Drink alcoholic beverages -Take any medication unless instructed by your physician -Make any legal decisions or sign important papers.  Meals: Start with liquid foods such as gelatin or soup. Progress to regular foods as tolerated. Avoid greasy, spicy, heavy foods. If nausea and/or vomiting occur, drink only clear liquids until the nausea and/or vomiting subsides. Call your physician if vomiting continues.  Special Instructions/Symptoms: Your throat may feel dry or sore from the anesthesia or the breathing tube placed in your throat during surgery. If this causes discomfort, gargle with warm salt water. The discomfort should disappear within 24 hours.  If you had a scopolamine patch placed behind your ear for the  management of post- operative nausea and/or vomiting:  1. The medication in the patch is effective for 72 hours, after which it should be removed.  Wrap patch in a tissue and discard in the trash. Wash hands thoroughly with soap and water. 2. You may remove the patch earlier than 72 hours if you experience unpleasant side effects which may include dry mouth, dizziness or visual disturbances. 3. Avoid touching the patch. Wash your hands with soap and water after contact with the patch.   your questions during regular business hours.  Please don't hesitate to call and ask to speak to one of the nurses for clinical concerns.  If you have a medical emergency, go to the nearest emergency room or call 911.  A surgeon from [facility] is always on call at the hospital.  For further questions, please visit [email or web address]

## 2022-11-23 NOTE — H&P (View-Only) (Signed)
  66 yof I saw 8/22 with noprior breast history. She has no family history of breast or ovarian cancer. She underwent a screening mammogram that shows B density breast. The left side was negative. On the right side she has several groups of calcifications in the upper outer and lateral right breast. There are 2 adjacent groups measuring 1 cm and 1.4 cm. There are additional cysts noted. 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 We elected to follow this at that time. This has remained fine. However she noted some blood in her bra from the right side. This was evaluated with mm/us and was negative. Due to discharge Dr Wein sent her for MRI. This shows b density breast tissue. It showed an oval mass within the right nipple and extending posteriorly measuring 9x4 mm in size. There is biopsy clip artifact at site of prior biopsy with no abnormality noted. The left breast is negative. The nodes show a prominent right level 1 ax node. US was done to confirm the mass involving the nipple and the right axilla was negative. She is here to discuss options. She has not had any more discharge  Review of Systems: A complete review of systems was obtained from the patient. I have reviewed this information and discussed as appropriate with the patient. See HPI as well for other ROS.  Review of Systems All other systems reviewed and are negative.   Medical History: Past Medical History: Diagnosis Date Arthritis  Patient Active Problem List Diagnosis Flat epithelial atypia of breast, right  History reviewed. No pertinent surgical history.  Allergies Allergen Reactions Phenothiazines Other (See Comments) Muscle regidity  No current outpatient medications on file prior to visit.   Family History Problem Relation Age of Onset Hyperlipidemia (Elevated cholesterol) Mother Lung cancer Father   Social History  Tobacco  Use Smoking Status Former Smokeless Tobacco Never Marital status: Married Tobacco Use Smoking status: Former Smokeless tobacco: Never Substance and Sexual Activity Alcohol use: Not Currently Drug use: Not Currently  Objective:   Physical Exam Vitals reviewed. Constitutional: Appearance: Normal appearance. Chest: Breasts: Right: No inverted nipple, mass or nipple discharge. Left: No inverted nipple, mass or nipple discharge. Lymphadenopathy: Upper Body: Right upper body: No supraclavicular or axillary adenopathy. Left upper body: No supraclavicular or axillary adenopathy. Neurological: Mental Status: She is alert.  Assessment and Plan:  Mass of nipple  Excision of right nipple mass  Can't really see anything on exam today. This appears to be source of discharge she had. I dont think anything needs to be done for FEA that she had before especially with follow up mm and MR that look fine. I also think that dont need to do anything further for the MR node finding with the negative US I do think reasonable to excise this mass given presence and likely source of bloody dc. We discussed excision of nipple vs just coring out the nipple via periareolar incision and awaiting pathology to determine next steps. Will plan on latter. Discussed surgery, risks and recovery. Will schedule.  

## 2022-11-23 NOTE — Anesthesia Procedure Notes (Signed)
Procedure Name: LMA Insertion Date/Time: 11/23/2022 9:01 AM  Performed by: Bufford Spikes, CRNAPre-anesthesia Checklist: Patient identified, Emergency Drugs available, Suction available and Patient being monitored Patient Re-evaluated:Patient Re-evaluated prior to induction Oxygen Delivery Method: Circle system utilized Preoxygenation: Pre-oxygenation with 100% oxygen Induction Type: IV induction Ventilation: Mask ventilation without difficulty LMA: LMA inserted LMA Size: 4.0 Number of attempts: 1 Placement Confirmation: positive ETCO2 Tube secured with: Tape Dental Injury: Teeth and Oropharynx as per pre-operative assessment

## 2022-11-23 NOTE — Transfer of Care (Signed)
Immediate Anesthesia Transfer of Care Note  Patient: Susan Stevens  Procedure(s) Performed: EXCISION OF RIGHT NIPPLE MASS (Right: Breast)  Patient Location: PACU  Anesthesia Type:General  Level of Consciousness: awake, alert , and oriented  Airway & Oxygen Therapy: Patient Spontanous Breathing and Patient connected to face mask oxygen  Post-op Assessment: Report given to RN and Post -op Vital signs reviewed and stable  Post vital signs: Reviewed and stable  Last Vitals:  Vitals Value Taken Time  BP    Temp    Pulse 84 11/23/22 0943  Resp 12 11/23/22 0943  SpO2 100 % 11/23/22 0943  Vitals shown include unvalidated device data.  Last Pain:  Vitals:   11/23/22 0750  TempSrc: Oral  PainSc: 0-No pain      Patients Stated Pain Goal: 4 (52/84/13 2440)  Complications: No notable events documented.

## 2022-11-23 NOTE — Anesthesia Preprocedure Evaluation (Signed)
Anesthesia Evaluation  Patient identified by MRN, date of birth, ID band Patient awake    Reviewed: Allergy & Precautions, H&P , NPO status , Patient's Chart, lab work & pertinent test results  Airway Mallampati: II  TM Distance: >3 FB Neck ROM: Full    Dental no notable dental hx.    Pulmonary neg pulmonary ROS   Pulmonary exam normal breath sounds clear to auscultation       Cardiovascular negative cardio ROS Normal cardiovascular exam Rhythm:Regular Rate:Normal     Neuro/Psych negative neurological ROS  negative psych ROS   GI/Hepatic negative GI ROS, Neg liver ROS,,,  Endo/Other  negative endocrine ROS    Renal/GU negative Renal ROS  negative genitourinary   Musculoskeletal  (+) Arthritis , Osteoarthritis,    Abdominal  (+) + obese  Peds negative pediatric ROS (+)  Hematology negative hematology ROS (+)   Anesthesia Other Findings   Reproductive/Obstetrics negative OB ROS                             Anesthesia Physical Anesthesia Plan  ASA: 2  Anesthesia Plan: General   Post-op Pain Management: Tylenol PO (pre-op)* and Minimal or no pain anticipated   Induction: Intravenous  PONV Risk Score and Plan: 3 and Ondansetron, Dexamethasone, Midazolam and Treatment may vary due to age or medical condition  Airway Management Planned: LMA  Additional Equipment:   Intra-op Plan:   Post-operative Plan: Extubation in OR  Informed Consent: I have reviewed the patients History and Physical, chart, labs and discussed the procedure including the risks, benefits and alternatives for the proposed anesthesia with the patient or authorized representative who has indicated his/her understanding and acceptance.     Dental advisory given  Plan Discussed with: CRNA  Anesthesia Plan Comments:        Anesthesia Quick Evaluation

## 2022-11-23 NOTE — H&P (Signed)
  52 yof I saw 8/22 with noprior breast history. She has no family history of breast or ovarian cancer. She underwent a screening mammogram that shows B density breast. The left side was negative. On the right side she has several groups of calcifications in the upper outer and lateral right breast. There are 2 adjacent groups measuring 1 cm and 1.4 cm. There are additional cysts noted. 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 We elected to follow this at that time. This has remained fine. However she noted some blood in her bra from the right side. This was evaluated with mm/us and was negative. Due to discharge Dr Stann Mainland sent her for MRI. This shows b density breast tissue. It showed an oval mass within the right nipple and extending posteriorly measuring 9x4 mm in size. There is biopsy clip artifact at site of prior biopsy with no abnormality noted. The left breast is negative. The nodes show a prominent right level 1 ax node. [place] was done to confirm the mass involving the nipple and the right axilla was negative. She is here to discuss options. She has not had any more discharge  Review of Systems: A complete review of systems was obtained from the patient. I have reviewed this information and discussed as appropriate with the patient. See HPI as well for other ROS.  Review of Systems All other systems reviewed and are negative.   Medical History: Past Medical History: Diagnosis Date Arthritis  Patient Active Problem List Diagnosis Flat epithelial atypia of breast, right  History reviewed. No pertinent surgical history.  Allergies Allergen Reactions Phenothiazines Other (See Comments) Muscle regidity  No current outpatient medications on file prior to visit.   Family History Problem Relation Age of Onset Hyperlipidemia (Elevated cholesterol) Mother Lung cancer Father   Social History  Tobacco  Use Smoking Status Former Smokeless Tobacco Never Marital status: Married Tobacco Use Smoking status: Former Smokeless tobacco: Never Substance and Sexual Activity Alcohol use: Not Currently Drug use: Not Currently  Objective:   Physical Exam Vitals reviewed. Constitutional: Appearance: Normal appearance. Chest: Breasts: Right: No inverted nipple, mass or nipple discharge. Left: No inverted nipple, mass or nipple discharge. Lymphadenopathy: Upper Body: Right upper body: No supraclavicular or axillary adenopathy. Left upper body: No supraclavicular or axillary adenopathy. Neurological: Mental Status: She is alert.  Assessment and Plan:  Mass of nipple  Excision of right nipple mass  Can't really see anything on exam today. This appears to be source of discharge she had. I dont think anything needs to be done for FEA that she had before especially with follow up mm and MR that look fine. I also think that dont need to do anything further for the MR node finding with the negative [place] I do think reasonable to excise this mass given presence and likely source of bloody dc. We discussed excision of nipple vs just coring out the nipple via periareolar incision and awaiting pathology to determine next steps. Will plan on latter. Discussed surgery, risks and recovery. Will schedule.

## 2022-12-08 ENCOUNTER — Other Ambulatory Visit: Payer: Self-pay | Admitting: General Surgery

## 2022-12-08 NOTE — Progress Notes (Signed)
Location of  Breast Cancer: Ductal carcinoma in situ (DCIS) of right breast     Histology per Pathology Report:     Receptor Status: ER(90%), PR (70%)  Did patient present with symptoms (if so, please note symptoms) or was this found on screening mammography? Routine mammogram  Past/Anticipated interventions by surgeon, if any: 12/14/2022 --Dr. Rolm Bookbinder Scheduled for: RIGHT NIPPLE AREOLA EXCISION   12/08/2022 --Dr. Rolm Bookbinder (office visit)  Right breast lumpectomy which will now be excision of the nipple and a portion of the areola We discussed her pathology and the noninvasive nature.  She does not need a sentinel lymph node biopsy due to its noninvasive nature.  I am going to refer her to see medical and radiation oncology.  She does at minimum need the nipple and areola both removed. We discussed the options for treatment of the breast cancer which included lumpectomy versus a mastectomy.  We also discussed that she will likely need radiation therapy if she undergoes lumpectomy.  We discussed mastectomy and the postoperative care for that as well. Mastectomy can be followed by reconstruction.  The decision for lumpectomy vs mastectomy has no impact on decision for chemotherapy.  Most mastectomy patients will not need radiation therapy.  We discussed that there is no difference in her survival whether she undergoes lumpectomy with radiation therapy or antiestrogen therapy versus a mastectomy.  There is also no real difference between her recurrence in the breast   11-23-22 --Dr. Rolm Bookbinder Right nipple mass excision   Past/Anticipated interventions by medical oncology, if any:  Scheduled for consultation with Dr. Nicholas Lose on 12/22/2022  Lymphedema issues, if any:  Denies   Pain issues, if any: Reports occasional pain to her breast over the last 4-5 days  SAFETY ISSUES: Prior radiation? No Pacemaker/ICD? No Possible current pregnancy? No Is  the patient on methotrexate? No   Current Complaints / other details:  Takes care of her 57 year old grand daughter in the mornings for her daughter

## 2022-12-11 LAB — SURGICAL PATHOLOGY

## 2022-12-11 NOTE — Progress Notes (Signed)
Radiation Oncology         [phone] ________________________________  Initial Outpatient Consultation by telephone.  The patient opted for telemedicine to maximize safety during the pandemic.  MyChart video was not obtainable.   Name: Susan Stevens MRN: [id number]  Date: 12/12/2022  DOB: Jul 14, 1956  KX:FGHW, Herbie [place], MD  Rolm Bookbinder, MD   REFERRING PHYSICIAN: Rolm Bookbinder, MD  DIAGNOSIS:    ICD-10-CM   1. 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 female who initially presented for a routine screening mammogram performed in June/July 2022 which showed calcifications in the right breast. No symptoms, if any, were reported at that time. Diagnostic right breast mammogram on 05/20/2021 redemonstrated several groups of indeterminate calcifications in the upper outer and lateral right breast, measuring 1 cm and 1.4 cm respectively, thought to be secondary to fibrocystic changes (given presence of multiple small bilateral cysts).    Biopsy of the upper outer right breast calcifications on date of 05/26/21 showed flat epithelial atypia and usual ductal hyperplasia with calcifications.    The patient was then referred to Dr. Donne Hazel and opted to proceed with observation and 6 month follow-up imaging vs. an excisional biopsy.    6 month follow-up diagnostic mammogram on 12/13/21 showed stability of the biopsy proven benign right breast calcifications.   Follow-up bilateral diagnostic mammogram and right breast ultrasound on 06/13/22 again showed stability of the right breast calcifications. No abnormalities were seen on the left. At the time of this study, the patient reported one episode of bloody  discharge from her right nipple. No mammographic or sonographic findings were seen to explain her bloody discharge.  However, the patient's PCP ordered a bilateral breast MRI on 09/14/22 which revealed an oval mass within the right nipple and extending posteriorly to the retro nipple region measuring 9 x 8 x 4 mm in size. MRI also showed a mildly prominent right level I axillary lymph node, and the multiple previously demonstrated benign bilateral breast cysts.   Ultrasound of the right breast on 09/28/22 showed no pathologic right axillary lymphadenopathy to correlate with MRI findings. [place] also redemonstrated the mass involving the right nipple measuring 0.9 cm.  Following discussion with Dr. Donne Hazel on 10/24/22, the patient agreed to proceed with coring out of the nipple via periareolar incision for biopsies of the mass. Pathology from excision of the right breast mass on 11/23/22 showed: tumor the size of 3.5 mm; histology of intermediate grade DCIS involving an intraductal papilloma; anterior margin positive for DCIS. ER status: 90% positive with moderate to strong staining intensity; PR status 70% positive with weak to strong staining intensity; Her2 not assessed.  Per Dr. Donne Hazel, the patient will, at minimum, require removal of both the right nipple and areola. During her most recent follow-up visit with Dr. Donne Hazel on 12/08/22, surgical options including breast conserving surgery vs mastectomy were also discussed with the patient. She will make a final decision regarding surgery based on discussions with Dr. Lindi Adie and myself.   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  She is scheduled for breast conserving surgery,  removal of right nipple and areolar complex, on 12/14/2022.  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    PAST SURGICAL HISTORY: Past Surgical History:  Procedure Laterality Date   BREAST BIOPSY Right 2022   DILATION AND EVACUATION     MASS EXCISION Right 11/23/2022   Procedure: EXCISION OF RIGHT NIPPLE MASS;  Surgeon: Emelia Loron, MD;  Location: [facility];  Service: General;  Laterality: Right;   MOUTH SURGERY     TUBAL LIGATION      FAMILY HISTORY: family history is not on file.  SOCIAL HISTORY:  reports that she has never smoked. She has never used smokeless tobacco. She reports that she does not drink alcohol and does not use drugs.  ALLERGIES: Phenothiazines  MEDICATIONS:  Current Outpatient Medications  Medication Sig Dispense Refill   oxybutynin (DITROPAN-XL) 5 MG 24 hr tablet Take 5 mg by mouth at bedtime.     pseudoephedrine-guaifenesin (MUCINEX D) 60-600 MG 12 hr tablet Take 1 tablet by mouth every 12 (twelve) hours.     No current facility-administered medications for this encounter.    REVIEW OF SYSTEMS: As above in HPI.   PHYSICAL EXAM:  vitals were not taken for this visit.   General: Alert and oriented, in no acute distress    LABORATORY DATA:  No results found for: "WBC", "HGB", "HCT", "MCV", "PLT" CMP  No results found for: "NA", "K", "CL", "CO2", "GLUCOSE", "BUN", "CREATININE", "CALCIUM", "PROT", "ALBUMIN", "AST", "ALT", "ALKPHOS", "BILITOT", "GFRNONAA", "GFRAA"       RADIOGRAPHY: As above   IMPRESSION/PLAN: DCIS of central right breast, ER positive, intermediate grade  It was a pleasure meeting the patient by phone today. We discussed the risks, benefits, and side effects of adjuvant radiotherapy in the setting of breast conservation.  Initially she was interested in mastectomy but after talking further with Dr. Dwain Sarna she is content with the plan for breast conserving surgery. I recommend radiotherapy to the right breast to reduce her risk of locoregional recurrence within  the breast parenchyma.  We discussed that radiation would take approximately 3-4 weeks to complete and that I would give the patient a few weeks to heal following surgery before starting treatment planning.   We spoke about acute effects including skin irritation and fatigue as well as much less common late effects including internal organ injury or irritation. We spoke about the latest technology that is used to minimize the risk of late effects for patients undergoing radiotherapy to the breast or chest wall. No guarantees of treatment were given. The patient is enthusiastic about proceeding with treatment. I look forward to participating in the patient's care.  I will await her referral back to me for postoperative follow-up and eventual CT simulation/treatment planning.  We discussed the purpose of her referral to Dr. Pamelia Hoit, and briefly reviewed antiestrogen therapy which she will discuss in more detail with her next week.  This encounter was provided by telemedicine platform; patient desired telemedicine during pandemic precautions.  Telephone was used as Restaurant manager, fast food was not available. The patient has given verbal consent for this type of encounter and has been advised to only accept a meeting of this type in a secure network environment. On date of service, in total, I spent 35 minutes on this encounter.   The attendants for this meeting include Lonie Peak  and Marjo Bicker  During the encounter, Eppie Gibson was located at [facility] Radiation Oncology Department.  Susan Stevens was located at home.   __________________________________________   Eppie Gibson, MD  This document serves as a record of services personally performed by Eppie Gibson, MD. It was created on her behalf by Roney Mans, a trained medical scribe. The creation of this record is based on the scribe's personal observations and the provider's statements to them. This document has been checked and  approved by the attending provider.

## 2022-12-12 ENCOUNTER — Encounter (HOSPITAL_BASED_OUTPATIENT_CLINIC_OR_DEPARTMENT_OTHER): Payer: Self-pay | Admitting: General Surgery

## 2022-12-12 ENCOUNTER — Other Ambulatory Visit: Payer: Self-pay

## 2022-12-12 ENCOUNTER — Ambulatory Visit
Admission: RE | Admit: 2022-12-12 | Discharge: 2022-12-12 | Disposition: A | Discharge status: Home / Self Care | Payer: Medicare Other | Source: Ambulatory Visit | Attending: Radiation Oncology | Admitting: Radiation Oncology

## 2022-12-12 ENCOUNTER — Encounter: Payer: Self-pay | Admitting: Radiation Oncology

## 2022-12-12 DIAGNOSIS — D0511 Intraductal carcinoma in situ of right breast: Secondary | ICD-10-CM

## 2022-12-14 ENCOUNTER — Ambulatory Visit (HOSPITAL_BASED_OUTPATIENT_CLINIC_OR_DEPARTMENT_OTHER): Payer: Medicare Other | Admitting: Anesthesiology

## 2022-12-14 ENCOUNTER — Other Ambulatory Visit: Payer: Self-pay

## 2022-12-14 ENCOUNTER — Encounter (HOSPITAL_BASED_OUTPATIENT_CLINIC_OR_DEPARTMENT_OTHER)
Admission: RE | Disposition: A | Discharge status: Home / Self Care | Payer: Self-pay | Source: Ambulatory Visit | Attending: General Surgery

## 2022-12-14 ENCOUNTER — Encounter (HOSPITAL_BASED_OUTPATIENT_CLINIC_OR_DEPARTMENT_OTHER): Payer: Self-pay | Admitting: General Surgery

## 2022-12-14 ENCOUNTER — Ambulatory Visit (HOSPITAL_BASED_OUTPATIENT_CLINIC_OR_DEPARTMENT_OTHER)
Admission: RE | Admit: 2022-12-14 | Discharge: 2022-12-14 | Disposition: A | Discharge status: Home / Self Care | Payer: Medicare Other | Source: Ambulatory Visit | Attending: General Surgery | Admitting: General Surgery

## 2022-12-14 DIAGNOSIS — N6310 Unspecified lump in the right breast, unspecified quadrant: Secondary | ICD-10-CM | POA: Diagnosis not present

## 2022-12-14 DIAGNOSIS — Z01818 Encounter for other preprocedural examination: Secondary | ICD-10-CM

## 2022-12-14 DIAGNOSIS — D0511 Intraductal carcinoma in situ of right breast: Secondary | ICD-10-CM | POA: Diagnosis present

## 2022-12-14 HISTORY — PX: BREAST CYST EXCISION: SHX579

## 2022-12-14 SURGERY — EXCISION, CYST, BREAST
Anesthesia: General | Site: Breast | Laterality: Right

## 2022-12-14 MED ORDER — MEPERIDINE HCL 25 MG/ML IJ SOLN: 6.2500 mg | INTRAMUSCULAR | Status: DC | PRN

## 2022-12-14 MED ORDER — LIDOCAINE HCL (CARDIAC) PF 100 MG/5ML IV SOSY
PREFILLED_SYRINGE | INTRAVENOUS | Status: DC | PRN
  Administered 2022-12-14: 50 mg via INTRAVENOUS

## 2022-12-14 MED ORDER — PROPOFOL 10 MG/ML IV BOLUS
INTRAVENOUS | Status: AC
  Filled 2022-12-14: qty 20

## 2022-12-14 MED ORDER — DEXAMETHASONE SODIUM PHOSPHATE 10 MG/ML IJ SOLN
INTRAMUSCULAR | Status: AC
  Filled 2022-12-14: qty 1

## 2022-12-14 MED ORDER — PROPOFOL 10 MG/ML IV BOLUS
INTRAVENOUS | Status: DC | PRN
  Administered 2022-12-14: 140 mg via INTRAVENOUS

## 2022-12-14 MED ORDER — CEFAZOLIN SODIUM-DEXTROSE 2-4 GM/100ML-% IV SOLN
2.0000 g | INTRAVENOUS | Status: AC
  Administered 2022-12-14: 2 g via INTRAVENOUS

## 2022-12-14 MED ORDER — ACETAMINOPHEN 500 MG PO TABS: 1000.0000 mg | ORAL_TABLET | Freq: Once | ORAL | Status: DC

## 2022-12-14 MED ORDER — AMISULPRIDE (ANTIEMETIC) 5 MG/2ML IV SOLN: 10.0000 mg | Freq: Once | INTRAVENOUS | Status: DC | PRN

## 2022-12-14 MED ORDER — ACETAMINOPHEN 500 MG PO TABS
ORAL_TABLET | ORAL | Status: AC
  Filled 2022-12-14: qty 2

## 2022-12-14 MED ORDER — EPHEDRINE SULFATE (PRESSORS) 50 MG/ML IJ SOLN
INTRAMUSCULAR | Status: DC | PRN
  Administered 2022-12-14: 7 mg via INTRAVENOUS
  Administered 2022-12-14: 5 mg via INTRAVENOUS
  Administered 2022-12-14: 7 mg via INTRAVENOUS

## 2022-12-14 MED ORDER — ACETAMINOPHEN 500 MG PO TABS
1000.0000 mg | ORAL_TABLET | ORAL | Status: AC
  Administered 2022-12-14: 1000 mg via ORAL

## 2022-12-14 MED ORDER — CHLORHEXIDINE GLUCONATE CLOTH 2 % EX PADS: 6.0000 | MEDICATED_PAD | Freq: Once | CUTANEOUS | Status: DC

## 2022-12-14 MED ORDER — OXYCODONE HCL 5 MG/5ML PO SOLN: 5.0000 mg | Freq: Once | ORAL | Status: DC | PRN

## 2022-12-14 MED ORDER — FENTANYL CITRATE (PF) 100 MCG/2ML IJ SOLN
INTRAMUSCULAR | Status: DC | PRN
  Administered 2022-12-14: 50 ug via INTRAVENOUS
  Administered 2022-12-14: 25 ug via INTRAVENOUS

## 2022-12-14 MED ORDER — BUPIVACAINE HCL (PF) 0.25 % IJ SOLN
INTRAMUSCULAR | Status: AC
  Filled 2022-12-14: qty 30

## 2022-12-14 MED ORDER — LIDOCAINE 2% (20 MG/ML) 5 ML SYRINGE
INTRAMUSCULAR | Status: AC
  Filled 2022-12-14: qty 5

## 2022-12-14 MED ORDER — LIDOCAINE-EPINEPHRINE (PF) 1 %-1:200000 IJ SOLN
INTRAMUSCULAR | Status: AC
  Filled 2022-12-14: qty 30

## 2022-12-14 MED ORDER — LACTATED RINGERS IV SOLN: INTRAVENOUS | Status: DC

## 2022-12-14 MED ORDER — TRAMADOL HCL 50 MG PO TABS: 50.0000 mg | ORAL_TABLET | Freq: Four times a day (QID) | ORAL | 0 refills | Status: DC | PRN

## 2022-12-14 MED ORDER — ENSURE PRE-SURGERY PO LIQD: 296.0000 mL | Freq: Once | ORAL | Status: DC

## 2022-12-14 MED ORDER — MIDAZOLAM HCL 2 MG/2ML IJ SOLN
INTRAMUSCULAR | Status: AC
  Filled 2022-12-14: qty 2

## 2022-12-14 MED ORDER — CEFAZOLIN SODIUM-DEXTROSE 2-4 GM/100ML-% IV SOLN
INTRAVENOUS | Status: AC
  Filled 2022-12-14: qty 100

## 2022-12-14 MED ORDER — OXYCODONE HCL 5 MG PO TABS: 5.0000 mg | ORAL_TABLET | Freq: Once | ORAL | Status: DC | PRN

## 2022-12-14 MED ORDER — HYDROMORPHONE HCL 1 MG/ML IJ SOLN: 0.2500 mg | INTRAMUSCULAR | Status: DC | PRN

## 2022-12-14 MED ORDER — FENTANYL CITRATE (PF) 100 MCG/2ML IJ SOLN
INTRAMUSCULAR | Status: AC
  Filled 2022-12-14: qty 2

## 2022-12-14 MED ORDER — LIDOCAINE HCL (PF) 1 % IJ SOLN
INTRAMUSCULAR | Status: AC
  Filled 2022-12-14: qty 5

## 2022-12-14 MED ORDER — BUPIVACAINE HCL (PF) 0.25 % IJ SOLN
INTRAMUSCULAR | Status: DC | PRN
  Administered 2022-12-14: 8 mL

## 2022-12-14 MED ORDER — MIDAZOLAM HCL 5 MG/5ML IJ SOLN
INTRAMUSCULAR | Status: DC | PRN
  Administered 2022-12-14 (×2): 1 mg via INTRAVENOUS

## 2022-12-14 MED ORDER — ONDANSETRON HCL 4 MG/2ML IJ SOLN
INTRAMUSCULAR | Status: AC
  Filled 2022-12-14: qty 2

## 2022-12-14 SURGICAL SUPPLY — 51 items
ADH SKN CLS APL DERMABOND .7 (GAUZE/BANDAGES/DRESSINGS) ×1
APL PRP STRL LF DISP 70% ISPRP (MISCELLANEOUS) ×1
BINDER BREAST LRG (GAUZE/BANDAGES/DRESSINGS) IMPLANT
BINDER BREAST MEDIUM (GAUZE/BANDAGES/DRESSINGS) IMPLANT
BINDER BREAST XLRG (GAUZE/BANDAGES/DRESSINGS) IMPLANT
BINDER BREAST XXLRG (GAUZE/BANDAGES/DRESSINGS) IMPLANT
BLADE SURG 15 STRL LF DISP TIS (BLADE) ×1 IMPLANT
BLADE SURG 15 STRL SS (BLADE) ×1
CANISTER SUCT 1200ML W/VALVE (MISCELLANEOUS) IMPLANT
CHLORAPREP W/TINT 26 (MISCELLANEOUS) ×1 IMPLANT
CLIP TI WIDE RED SMALL 6 (CLIP) IMPLANT
COVER BACK TABLE 60X90IN (DRAPES) ×1 IMPLANT
COVER MAYO STAND STRL (DRAPES) ×1 IMPLANT
DERMABOND ADVANCED .7 DNX12 (GAUZE/BANDAGES/DRESSINGS) IMPLANT
DRAPE LAPAROSCOPIC ABDOMINAL (DRAPES) ×1 IMPLANT
DRAPE UTILITY XL STRL (DRAPES) ×1 IMPLANT
DRSG TEGADERM 4X4.75 (GAUZE/BANDAGES/DRESSINGS) ×1 IMPLANT
ELECT COATED BLADE 2.86 ST (ELECTRODE) ×1 IMPLANT
ELECT REM PT RETURN 9FT ADLT (ELECTROSURGICAL) ×1
ELECTRODE REM PT RTRN 9FT ADLT (ELECTROSURGICAL) ×1 IMPLANT
GAUZE SPONGE 4X4 12PLY STRL LF (GAUZE/BANDAGES/DRESSINGS) ×1 IMPLANT
GLOVE BIO SURGEON STRL SZ7 (GLOVE) ×1 IMPLANT
GLOVE BIOGEL PI IND STRL 7.5 (GLOVE) ×1 IMPLANT
GOWN STRL REUS W/ TWL LRG LVL3 (GOWN DISPOSABLE) ×3 IMPLANT
GOWN STRL REUS W/TWL LRG LVL3 (GOWN DISPOSABLE) ×3
KIT MARKER MARGIN INK (KITS) ×1 IMPLANT
NDL HYPO 25X1 1.5 SAFETY (NEEDLE) ×1 IMPLANT
NEEDLE HYPO 25X1 1.5 SAFETY (NEEDLE) ×1 IMPLANT
NS IRRIG 1000ML POUR BTL (IV SOLUTION) IMPLANT
PACK BASIN DAY SURGERY FS (CUSTOM PROCEDURE TRAY) ×1 IMPLANT
PENCIL SMOKE EVACUATOR (MISCELLANEOUS) ×1 IMPLANT
RETRACTOR ONETRAX LX 90X20 (MISCELLANEOUS) IMPLANT
SLEEVE SCD COMPRESS KNEE MED (STOCKING) ×1 IMPLANT
SPIKE FLUID TRANSFER (MISCELLANEOUS) IMPLANT
SPONGE T-LAP 4X18 ~~LOC~~+RFID (SPONGE) ×1 IMPLANT
STRIP CLOSURE SKIN 1/2X4 (GAUZE/BANDAGES/DRESSINGS) ×1 IMPLANT
SUT ETHILON 3 0 PS 1 (SUTURE) IMPLANT
SUT MNCRL AB 4-0 PS2 18 (SUTURE) IMPLANT
SUT MON AB 5-0 PS2 18 (SUTURE) IMPLANT
SUT SILK 2 0 SH (SUTURE) ×1 IMPLANT
SUT VIC AB 2-0 SH 27 (SUTURE) ×1
SUT VIC AB 2-0 SH 27XBRD (SUTURE) ×1 IMPLANT
SUT VIC AB 3-0 SH 27 (SUTURE) ×1
SUT VIC AB 3-0 SH 27X BRD (SUTURE) ×1 IMPLANT
SUT VIC AB 5-0 PS2 18 (SUTURE) IMPLANT
SUT VICRYL AB 3 0 TIES (SUTURE) IMPLANT
SYR CONTROL 10ML LL (SYRINGE) ×1 IMPLANT
TOWEL GREEN STERILE FF (TOWEL DISPOSABLE) ×1 IMPLANT
TRAY FAXITRON CT DISP (TRAY / TRAY PROCEDURE) IMPLANT
TUBE CONNECTING 20X1/4 (TUBING) IMPLANT
YANKAUER SUCT BULB TIP NO VENT (SUCTIONS) IMPLANT

## 2022-12-14 NOTE — Anesthesia Preprocedure Evaluation (Addendum)
Anesthesia Evaluation  Patient identified by MRN, date of birth, ID band Patient awake    Reviewed: Allergy & Precautions, H&P , NPO status , Patient's Chart, lab work & pertinent test results  Airway Mallampati: II  TM Distance: >3 FB Neck ROM: Full    Dental no notable dental hx. (+) Dental Advisory Given   Pulmonary neg pulmonary ROS   Pulmonary exam normal breath sounds clear to auscultation       Cardiovascular negative cardio ROS Normal cardiovascular exam Rhythm:Regular Rate:Normal     Neuro/Psych negative neurological ROS  negative psych ROS   GI/Hepatic negative GI ROS, Neg liver ROS,,,  Endo/Other  negative endocrine ROS    Renal/GU negative Renal ROS     Musculoskeletal  (+) Arthritis , Osteoarthritis,    Abdominal  (+) + obese  Peds  Hematology negative hematology ROS (+)   Anesthesia Other Findings   Reproductive/Obstetrics negative OB ROS                              Anesthesia Physical Anesthesia Plan  ASA: 2  Anesthesia Plan: General   Post-op Pain Management: Tylenol PO (pre-op)* and Toradol IV (intra-op)*   Induction: Intravenous  PONV Risk Score and Plan: 3 and Ondansetron, Dexamethasone, Midazolam and Treatment may vary due to age or medical condition  Airway Management Planned: LMA  Additional Equipment:   Intra-op Plan:   Post-operative Plan: Extubation in OR  Informed Consent: I have reviewed the patients History and Physical, chart, labs and discussed the procedure including the risks, benefits and alternatives for the proposed anesthesia with the patient or authorized representative who has indicated his/her understanding and acceptance.     Dental advisory given  Plan Discussed with: CRNA  Anesthesia Plan Comments:         Anesthesia Quick Evaluation

## 2022-12-14 NOTE — Anesthesia Procedure Notes (Signed)
Procedure Name: LMA Insertion Date/Time: 12/14/2022 11:28 AM  Performed by: Garrel Ridgel, CRNAPre-anesthesia Checklist: Patient identified, Emergency Drugs available, Suction available and Patient being monitored Patient Re-evaluated:Patient Re-evaluated prior to induction Oxygen Delivery Method: Circle system utilized Preoxygenation: Pre-oxygenation with 100% oxygen Induction Type: IV induction Ventilation: Mask ventilation without difficulty LMA: LMA inserted LMA Size: 4.0 Number of attempts: 1 Placement Confirmation: positive ETCO2 and breath sounds checked- equal and bilateral Tube secured with: Tape Dental Injury: Teeth and Oropharynx as per pre-operative assessment

## 2022-12-14 NOTE — Interval H&P Note (Signed)
History and Physical Interval Note:  12/14/2022 9:39 AM  Susan Stevens  has presented today for surgery, with the diagnosis of RIGHT BREAST DCIS.  The various methods of treatment have been discussed with the patient and family. After consideration of risks, benefits and other options for treatment, the patient has consented to  Procedure(s): RIGHT NIPPLE AREOLA EXCISION (Right) as a surgical intervention.  The patient's history has been reviewed, patient examined, no change in status, stable for surgery.  I have reviewed the patient's chart and labs.  Questions were answered to the patient's satisfaction.     Rolm Bookbinder

## 2022-12-14 NOTE — Anesthesia Postprocedure Evaluation (Signed)
Anesthesia Post Note  Patient: Susan Stevens  Procedure(s) Performed: RIGHT NIPPLE AREOLA EXCISION (Right: Breast)     Patient location during evaluation: PACU Anesthesia Type: General Level of consciousness: sedated and patient cooperative Pain management: pain level controlled Vital Signs Assessment: post-procedure vital signs reviewed and stable Respiratory status: spontaneous breathing Cardiovascular status: stable Anesthetic complications: no   No notable events documented.  Last Vitals:  Vitals:   12/14/22 1230 12/14/22 1245  BP: 116/76 125/78  Pulse: 79 74  Resp: 15 18  Temp:  36.4 C  SpO2: 94% 96%    Last Pain:  Vitals:   12/14/22 1245  TempSrc: Oral  PainSc: 0-No pain                 Nolon Nations

## 2022-12-14 NOTE — Discharge Instructions (Addendum)
[place] Office Phone Number [phone]  POST OP INSTRUCTIONS Take 400 mg of ibuprofen every 8 hours or 650 mg tylenol every 6 hours for next 72 hours then as needed. Use ice several times daily also.  A prescription for pain medication may be given to you upon discharge.  Take your pain medication as prescribed, if needed.  If narcotic pain medicine is not needed, then you may take acetaminophen (Tylenol), naprosyn (Alleve) or ibuprofen (Advil) as needed. No Tylenol until after 3:45pm today. Take your usually prescribed medications unless otherwise directed If you need a refill on your pain medication, please contact your pharmacy.  They will contact our office to request authorization.  Prescriptions will not be filled after 5pm or on week-ends. You should eat very light the first 24 hours after surgery, such as soup, crackers, pudding, etc.  Resume your normal diet the day after surgery. Most patients will experience some swelling and bruising in the breast.  Ice packs and a good support bra will help.  Wear the breast binder provided or a sports bra for 72 hours day and night.  After that wear a sports bra during the day until you return to the office. Swelling and bruising can take several days to resolve.  It is common to experience some constipation if taking pain medication after surgery.  Increasing fluid intake and taking a stool softener will usually help or prevent this problem from occurring.  A mild laxative (Milk of Magnesia or Miralax) should be taken according to package directions if there are no bowel movements after 48 hours. I used skin glue on the incision, you may shower in 24 hours.  The glue will flake off over the next 2-3 weeks.  Any sutures or staples will be removed at the office during your follow-up visit. ACTIVITIES:  You may resume regular daily activities (gradually increasing) beginning the next day.  Wearing a good support bra or sports bra  minimizes pain and swelling.  You may have sexual intercourse when it is comfortable. You may drive when you no longer are taking prescription pain medication, you can comfortably wear a seatbelt, and you can safely maneuver your car and apply brakes. RETURN TO WORK:  ______________________________________________________________________________________ Dennis Bast should see your doctor in the office for a follow-up appointment approximately two weeks after your surgery.  Your doctor's nurse will typically make your follow-up appointment when she calls you with your pathology report.  Expect your pathology report 3-4 business days after your surgery.  You may call to check if you do not hear from [place] after three days. OTHER INSTRUCTIONS: _______________________________________________________________________________________________ _____________________________________________________________________________________________________________________________________ _____________________________________________________________________________________________________________________________________ _____________________________________________________________________________________________________________________________________  WHEN TO CALL DR WAKEFIELD: Fever over 101.0 Nausea and/or vomiting. Extreme swelling or bruising. Continued bleeding from incision. Increased pain, redness, or drainage from the incision.  The clinic staff is available to answer your questions during regular business hours.  Please don't hesitate to call and ask to speak to one of the nurses for clinical concerns.  If you have a medical emergency, go to the nearest emergency room or call 911.  A surgeon from [facility] is always on call at the hospital.  For further questions, please visit [email or web address]   Post Anesthesia Home Care Instructions  Activity: Get plenty of rest for the remainder of the  day. A responsible individual must stay with you for 24 hours following the procedure.  For the next 24 hours, DO NOT: -Drive a car -Paediatric nurse -Drink alcoholic beverages -  Take any medication unless instructed by your physician -Make any legal decisions or sign important papers.  Meals: Start with liquid foods such as gelatin or soup. Progress to regular foods as tolerated. Avoid greasy, spicy, heavy foods. If nausea and/or vomiting occur, drink only clear liquids until the nausea and/or vomiting subsides. Call your physician if vomiting continues.  Special Instructions/Symptoms: Your throat may feel dry or sore from the anesthesia or the breathing tube placed in your throat during surgery. If this causes discomfort, gargle with warm salt water. The discomfort should disappear within 24 hours.  If you had a scopolamine patch placed behind your ear for the management of post- operative nausea and/or vomiting:  1. The medication in the patch is effective for 72 hours, after which it should be removed.  Wrap patch in a tissue and discard in the trash. Wash hands thoroughly with soap and water. 2. You may remove the patch earlier than 72 hours if you experience unpleasant side effects which may include dry mouth, dizziness or visual disturbances. 3. Avoid touching the patch. Wash your hands with soap and water after contact with the patch.

## 2022-12-14 NOTE — Transfer of Care (Signed)
Immediate Anesthesia Transfer of Care Note  Patient: Susan Stevens  Procedure(s) Performed: RIGHT NIPPLE AREOLA EXCISION (Right: Breast)  Patient Location: PACU  Anesthesia Type:General  Level of Consciousness: awake, alert , oriented, and patient cooperative  Airway & Oxygen Therapy: Patient Spontanous Breathing and Patient connected to face mask oxygen  Post-op Assessment: Report given to RN and Post -op Vital signs reviewed and stable  Post vital signs: Reviewed and stable  Last Vitals:  Vitals Value Taken Time  BP 117/63 12/14/22 1203  Temp    Pulse 70 12/14/22 1204  Resp 14 12/14/22 1204  SpO2 100 % 12/14/22 1204  Vitals shown include unvalidated device data.  Last Pain:  Vitals:   12/14/22 0937  TempSrc: Oral  PainSc: 0-No pain      Patients Stated Pain Goal: 6 (56/31/49 7026)  Complications: No notable events documented.

## 2022-12-14 NOTE — Op Note (Signed)
Preoperative diagnosis: right nipple mass s/p excision with pathology showing dcis and positive anterior margin (nipple) Postoperative diagnosis: Same as above Procedure: Right nipple and areola excision (margin re-excision) Surgeon: Dr. Serita Grammes Anesthesia: General Estimated blood loss: Minimal Specimens: Right nipple and areola marked short superior, long lateral Complications: None Drains: None Sponge needle count was correct completion Disposition to recovery in stable condition   Indications: 8 yof s/p right nipple mass excision. Pathology showed some dcis. This was positive at anterior margin which was the nipple. We discussed excision of nipple and a portion of the areola.    Procedure: After informed consent was obtained the patient was taken to the operating room.  She was given antibiotics.  SCDs were in place.  She was placed under general anesthesia without complication.  She was prepped and draped in the standard sterile surgical fashion.  Surgical timeout was then performed.    I infiltrated Marcaine.  I made an elliptical incision that extended to my periareolar incision and encompassed the nipple and portion of the areola. I removed this and the posterior margin is the old cavity.   This was then marked and passed off the table.  This was sent to pathology.  I then obtained hemostasis.  I closed the breast tissue with 2-0 Vicryl.  The skin was closed with 3-0 Vicryl and 4-0 Monocryl.  I placed one external 3-0 nylon suture. Glue and Steri-Strips were applied.  She tolerated this well was extubated and transferred to recovery stable.

## 2022-12-15 ENCOUNTER — Encounter (HOSPITAL_BASED_OUTPATIENT_CLINIC_OR_DEPARTMENT_OTHER): Payer: Self-pay | Admitting: General Surgery

## 2022-12-18 LAB — SURGICAL PATHOLOGY

## 2022-12-22 ENCOUNTER — Inpatient Hospital Stay: Payer: Medicare Other

## 2022-12-22 ENCOUNTER — Inpatient Hospital Stay: Payer: Medicare Other | Attending: Hematology and Oncology | Admitting: Hematology and Oncology

## 2022-12-22 VITALS — BP 154/92 | HR 98 | Temp 97.9°F | Resp 17 | Wt 176.1 lb

## 2022-12-22 DIAGNOSIS — D0511 Intraductal carcinoma in situ of right breast: Secondary | ICD-10-CM | POA: Insufficient documentation

## 2022-12-22 DIAGNOSIS — Z801 Family history of malignant neoplasm of trachea, bronchus and lung: Secondary | ICD-10-CM | POA: Insufficient documentation

## 2022-12-22 DIAGNOSIS — N3281 Overactive bladder: Secondary | ICD-10-CM | POA: Insufficient documentation

## 2022-12-22 DIAGNOSIS — Z79899 Other long term (current) drug therapy: Secondary | ICD-10-CM | POA: Insufficient documentation

## 2022-12-22 NOTE — Progress Notes (Signed)
[facility] CONSULT NOTE  Patient Care Team: Vania Rea, MD as PCP - General (Obstetrics and Gynecology)  CHIEF COMPLAINTS/PURPOSE OF CONSULTATION:  Newly diagnosed right breast DCIS  HISTORY OF PRESENTING ILLNESS:  Susan Stevens 67 y.o. female is here because of recent diagnosis of right breast DCIS.  She had screening mammograms every 6 months because of nipple discharge in the initial ultrasounds did not reveal any abnormalities until the breast MRI on 09/14/2022 was performed which revealed 9 mm mass within the right nipple extending into the retronipple region considerations for papilloma.  Patient was seen by Dr. Donne Hazel who performed initial surgery that revealed papilloma with DCIS.  However the anterior margin was positive and it was ER/PR positive.  She subsequently underwent second surgery and had clear margins.  She was referred to radiation and asked to discuss adjuvant treatment options.  She is healing and recovering very well from the surgery.  I reviewed her records extensively and collaborated the history with the patient.  SUMMARY OF ONCOLOGIC HISTORY: Oncology History  Ductal carcinoma in situ (DCIS) of right breast  11/23/2022 Initial Diagnosis   Breast MRI detected 9 mm retroareolar right breast mass, excision revealed DCIS grade 2 involving intraductal papilloma, anterior margin was positive, resection of the margins on 12/14/2022: No further DCIS and margins were clear.  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 07/04/2021   OAB (overactive bladder)     SURGICAL HISTORY: Past Surgical History:  Procedure Laterality Date   BREAST BIOPSY Right 2022   BREAST CYST EXCISION Right 12/14/2022   Procedure: RIGHT NIPPLE AREOLA EXCISION;  Surgeon: Rolm Bookbinder, MD;  Location: [place];  Service: General;  Laterality: Right;   DILATION  AND EVACUATION     MASS EXCISION Right 11/23/2022   Procedure: EXCISION OF RIGHT NIPPLE MASS;  Surgeon: Rolm Bookbinder, MD;  Location: [facility];  Service: General;  Laterality: Right;   MOUTH SURGERY     TUBAL LIGATION      SOCIAL HISTORY: Social History   Socioeconomic History   Marital status: Married    Spouse name: Not on file   Number of children: Not on file   Years of education: Not on file   Highest education level: Not on file  Occupational History   Not on file  Tobacco Use   Smoking status: Never   Smokeless tobacco: Never  Vaping Use   Vaping Use: Never used  Substance and Sexual Activity   Alcohol use: Never   Drug use: Never   Sexual activity: Not Currently    Birth control/protection: Post-menopausal  Other Topics Concern   Not on file  Social History Narrative   Not on file   Social Determinants of Health   Financial Resource Strain: Not on file  Food Insecurity: No Food Insecurity (12/12/2022)   Hunger Vital Sign    Worried About Running Out of Food in the Last Year: Never true    Ran Out of Food in the Last Year: Never true  Transportation Needs: No Transportation Needs (12/12/2022)   PRAPARE - Hydrologist (Medical): No    Lack of Transportation (Non-Medical): No  Physical Activity: Not on file  Stress: Not on file  Social Connections: Not on file  Intimate Partner Violence: Not At Risk (12/12/2022)  Humiliation, Afraid, Rape, and Kick questionnaire    Fear of Current or Ex-Partner: No    Emotionally Abused: No    Physically Abused: No    Sexually Abused: No    FAMILY HISTORY: Family History  Problem Relation Age of Onset   Lung cancer Father     ALLERGIES:  is allergic to phenothiazines.  MEDICATIONS:  Current Outpatient Medications  Medication Sig Dispense Refill   oxybutynin (DITROPAN-XL) 5 MG 24 hr tablet Take 5 mg by mouth at bedtime.     No current facility-administered  medications for this visit.    REVIEW OF SYSTEMS:   Constitutional: Denies fevers, chills or abnormal night sweats   All other systems were reviewed with the patient and are negative.  PHYSICAL EXAMINATION: ECOG PERFORMANCE STATUS: 0 - Asymptomatic  Vitals:   12/22/22 1233  BP: (!) 154/92  Pulse: 98  Resp: 17  Temp: 97.9 F (36.6 C)  SpO2: 99%   Filed Weights   12/22/22 1233  Weight: 176 lb 1 oz (79.9 kg)    GENERAL:alert, no distress and comfortable     RADIOGRAPHIC STUDIES: I have personally reviewed the radiological reports and agreed with the findings in the report.  ASSESSMENT AND PLAN:  Ductal carcinoma in situ (DCIS) of right breast 12/03/2022: Right nipple excision: DCIS grade 2 involving intraductal papilloma, anterior margin positive, ER 90%, PR 70% 12/14/2022: Nipple and areola right margin excision: Focal minimal residual intraductal papilloma with squamous metaplasia, no residual DCIS  Pathology review: I discussed with the patient the difference between DCIS and invasive breast cancer. It is considered a precancerous lesion. DCIS is classified as a 0. It is generally detected through mammograms as calcifications. We discussed the significance of grades and its impact on prognosis. We also discussed the importance of ER and PR receptors and their implications to adjuvant treatment options. Prognosis of DCIS dependence on grade, comedo necrosis. It is anticipated that if not treated, 20-30% of DCIS can develop into invasive breast cancer.  Recommendation: 1. adjuvant radiation therapy 2. Followed by antiestrogen therapy with tamoxifen 5 years  Tamoxifen counseling: We discussed the risks and benefits of tamoxifen. These include but not limited to insomnia, hot flashes, mood changes, vaginal dryness, and weight gain. Although rare, serious side effects including endometrial cancer, risk of blood clots were also discussed. We strongly believe that the benefits far  outweigh the risks. Patient understands these risks and consented to starting treatment. Planned treatment duration is 5 years.  Return to clinic after radiation to start tamoxifen. There is breast cancer in the father side of the family.  Therefore I will refer her to genetic counseling. Patient's sister-in-law Jana Half is a patient of mine.  All questions were answered. The patient knows to call the clinic with any problems, questions or concerns.    Harriette Ohara, MD 12/22/22

## 2022-12-22 NOTE — Assessment & Plan Note (Signed)
12/03/2022: Right nipple excision: DCIS grade 2 involving intraductal papilloma, anterior margin positive, ER 90%, PR 70% 12/14/2022: Nipple and areola right margin excision: Focal minimal residual intraductal papilloma with squamous metaplasia, no residual DCIS  Pathology review: I discussed with the patient the difference between DCIS and invasive breast cancer. It is considered a precancerous lesion. DCIS is classified as a 0. It is generally detected through mammograms as calcifications. We discussed the significance of grades and its impact on prognosis. We also discussed the importance of ER and PR receptors and their implications to adjuvant treatment options. Prognosis of DCIS dependence on grade, comedo necrosis. It is anticipated that if not treated, 20-30% of DCIS can develop into invasive breast cancer.  Recommendation: 1. adjuvant radiation therapy 2. Followed by antiestrogen therapy with tamoxifen 5 years  Tamoxifen counseling: We discussed the risks and benefits of tamoxifen. These include but not limited to insomnia, hot flashes, mood changes, vaginal dryness, and weight gain. Although rare, serious side effects including endometrial cancer, risk of blood clots were also discussed. We strongly believe that the benefits far outweigh the risks. Patient understands these risks and consented to starting treatment. Planned treatment duration is 5 years.  Return to clinic after radiation to start tamoxifen.

## 2022-12-25 ENCOUNTER — Telehealth: Payer: Self-pay | Admitting: Licensed Clinical Social Worker

## 2022-12-25 NOTE — Telephone Encounter (Signed)
[facility] Work  Clinical Social Work was referred by new patient protocol for assessment of psychosocial needs.  Clinical Social Worker contacted patient by phone  to offer support and assess for needs.   Patient unable to speak at the moment as she was at work. CSW provided direct contact information and pt stated she will call back.     [place], [place] Worker [employer]

## 2022-12-26 ENCOUNTER — Encounter: Payer: Self-pay | Admitting: *Deleted

## 2022-12-26 DIAGNOSIS — D0511 Intraductal carcinoma in situ of right breast: Secondary | ICD-10-CM

## 2022-12-27 ENCOUNTER — Inpatient Hospital Stay: Payer: Medicare Other | Admitting: Licensed Clinical Social Worker

## 2022-12-27 NOTE — Progress Notes (Signed)
[facility] Work  Initial Assessment   Susan Stevens is a 67 y.o. year old female contacted by phone. Clinical Social Work was referred by new patient protocol for assessment of psychosocial needs.   SDOH (Social Determinants of Health) assessments performed: Yes SDOH Interventions    Flowsheet Row CONSULT from 12/12/2022 in [facility] Radiation Oncology  SDOH Interventions   Food Insecurity Interventions Intervention Not Indicated  Transportation Interventions Intervention Not Indicated  Utilities Interventions Intervention Not Indicated       SDOH Screenings   Food Insecurity: No Food Insecurity (12/12/2022)  Transportation Needs: No Transportation Needs (12/12/2022)  Utilities: Not At Risk (12/12/2022)  Depression (PHQ2-9): Low Risk  (12/12/2022)  Tobacco Use: Low Risk  (12/15/2022)     Distress Screen completed: No     No data to display            Family/Social Information:  Housing Arrangement: patient lives with husband . They watch their 2yo granddaughter in the mornings. They also raise poodles and goats Family members/support persons in your life? Family, Friends, Building surveyor concerns: no  Employment: Working part time setting up medical supplies in homes.  Income source: Employment. Had short-term disability after 1st surgery and had a cancer plan through [employer] concerns: No Type of concern: None Food access concerns: no Services Currently in place:  [employer] cancer plan  Coping/ Adjustment to diagnosis: Patient understands treatment plan and what happens next? yes, has had surgery, now has radiation upcoming. Pt has a very busy life, helping take care of her granddaughter, supporting her SIL in caring for brother who had a stroke, elderly mother, and then her job and raising poodles. She may start moving towards retirement this year Concerns about diagnosis and/or treatment:  Memory issues post-surgery Patient enjoys  time with family/ friends and her animals Current coping skills/ strengths: Ability for insight , Capable of independent living , Armed forces logistics/support/administrative officer , Motivation for treatment/growth , Special hobby/interest , and Supportive family/friends     SUMMARY: Current SDOH Barriers:  No significant SDOH barriers noted today  Clinical Social Work Clinical Goal(s):  No clinical social work goals at this time  Interventions: Discussed common feeling and emotions when being diagnosed with cancer, and the importance of support during treatment Informed patient of the support team roles and support services at [facility] Provided [place] contact information and encouraged patient to call with any questions or concerns Provided information on [employer] as local support resource for pt   Follow Up Plan: Patient will contact CSW with any support or resource needs Patient verbalizes understanding of plan: Yes    Keven Soucy E Allyn Bartelson, LCSW

## 2023-01-01 ENCOUNTER — Encounter: Payer: Self-pay | Admitting: Genetic Counselor

## 2023-01-02 ENCOUNTER — Encounter: Payer: Self-pay | Admitting: *Deleted

## 2023-01-03 ENCOUNTER — Encounter: Payer: Self-pay | Admitting: Genetic Counselor

## 2023-01-03 ENCOUNTER — Inpatient Hospital Stay: Payer: Medicare Other

## 2023-01-03 ENCOUNTER — Inpatient Hospital Stay (HOSPITAL_BASED_OUTPATIENT_CLINIC_OR_DEPARTMENT_OTHER): Payer: Medicare Other | Admitting: Genetic Counselor

## 2023-01-03 ENCOUNTER — Other Ambulatory Visit: Payer: Self-pay | Admitting: Genetic Counselor

## 2023-01-03 DIAGNOSIS — Z803 Family history of malignant neoplasm of breast: Secondary | ICD-10-CM | POA: Diagnosis not present

## 2023-01-03 DIAGNOSIS — D0511 Intraductal carcinoma in situ of right breast: Secondary | ICD-10-CM | POA: Diagnosis not present

## 2023-01-03 LAB — GENETIC SCREENING ORDER

## 2023-01-03 NOTE — Progress Notes (Signed)
REFERRING PROVIDER: Nicholas Lose, MD [place],  [place] [address]  PRIMARY PROVIDER:  Vania Rea, MD  PRIMARY REASON FOR VISIT:  1. Family history of breast cancer   2. Ductal carcinoma in situ (DCIS) of right breast      HISTORY OF PRESENT ILLNESS:   Susan Stevens, a 67 y.o. female, was seen for a [facility] cancer genetics consultation at the request of Dr. Lindi Adie due to a personal and family history of breast cancer.  Susan Stevens presents to clinic today to discuss the possibility of a hereditary predisposition to cancer, genetic testing, and to further clarify her future cancer risks, as well as potential cancer risks for family members.   In January 2024, at the age of 67, Susan Stevens was diagnosed with DCIS of the right breast. The treatment plan included lumpectomy and radiation.     CANCER HISTORY:  Oncology History  Ductal carcinoma in situ (DCIS) of right breast  11/23/2022 Initial Diagnosis   Breast MRI detected 9 mm retroareolar right breast mass, excision revealed DCIS grade 2 involving intraductal papilloma, anterior margin was positive, resection of the margins on 12/14/2022: No further DCIS and margins were clear.  ER 90%, PR 70%      RISK FACTORS:  Menarche was at age 58-14.  First live birth at age 9.  OCP use for approximately 5 years.  Ovaries intact: yes.  Hysterectomy: no.  Menopausal status: postmenopausal.  HRT use: 0 years. Colonoscopy: no; not examined. Mammogram within the last year: yes. Number of breast biopsies: 1. Up to date with pelvic exams: yes. 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;  Service: General;  Laterality: Right;   DILATION AND EVACUATION     MASS EXCISION Right 11/23/2022   Procedure: EXCISION OF RIGHT NIPPLE MASS;  Surgeon: Rolm Bookbinder, MD;  Location: [place];  Service: General;  Laterality: Right;   MOUTH SURGERY     TUBAL LIGATION      Social History   Socioeconomic History   Marital status: Married    Spouse name: Not on file   Number of children: Not on file   Years of education: Not on file   Highest education level: Not on file  Occupational History   Not on file  Tobacco Use   Smoking status: Never   Smokeless tobacco: Never  Vaping Use   Vaping Use: Never used  Substance and Sexual Activity   Alcohol use: Never   Drug use: Never   Sexual activity: Not Currently    Birth control/protection: Post-menopausal  Other Topics Concern   Not on file  Social History Narrative   Not on file   Social Determinants of Health   Financial Resource Strain: Not on file  Food Insecurity: No Food Insecurity (12/12/2022)   Hunger Vital Sign    Worried About Running Out of Food in the Last Year: Never true    Ran Out of Food in the Last Year: Never true  Transportation Needs: No Transportation Needs (12/12/2022)  PRAPARE - Hydrologist (Medical): No    Lack of Transportation (Non-Medical): No  Physical Activity: Not on file  Stress: Not on file  Social Connections: Not on file     FAMILY HISTORY:  We obtained a detailed, 4-generation family history.  Significant diagnoses are listed below: Family History  Problem Relation Age of Onset   Memory loss Mother    Breast cancer Mother        ?? calcifications   Lung cancer Father    Lung cancer Paternal Grandmother    Lung cancer Paternal Grandfather    Lung cancer Paternal Aunt    Dementia Maternal Aunt    Dementia Maternal Uncle      The patient has three  sons and a daughter.  One son is adopted.  None have cancer.  She has two brothers, one who had skin cancer.  Her mother is living and her father is deceased.  The patient's mother had a lumpectomy for breast calcifications.  Her two siblings were cancer free.  There is no other reported family history of cancer.  The patient's father died of lung cancer.  He had one sister who had breast cancer.  The maternal grandparents are deceased from lung cancer.  The grandfather had a sister with breast cancer, a brother with stomach cancer and a niece with breast cancer.  Susan Stevens is unaware of previous family history of genetic testing for hereditary cancer risks. Patient's maternal ancestors are of Caucasian descent, and paternal ancestors are of Caucasian descent. There is no reported Ashkenazi Jewish ancestry. There is no known consanguinity.  GENETIC COUNSELING ASSESSMENT: Susan Stevens is a 67 y.o. female with a personal and family history of breast cancer which is somewhat suggestive of a hereditary cancer syndrome and predisposition to cancer given the number of women with breast cancer. We, therefore, discussed and recommended the following at today's visit.   DISCUSSION: We discussed that, in general, most cancer is not inherited in families, but instead is sporadic or familial. Sporadic cancers occur by chance and typically happen at older ages (>50 years) as this type of cancer is caused by genetic changes acquired during an individual's lifetime. Some families have more cancers than would be expected by chance; however, the ages or types of cancer are not consistent with a known genetic mutation or known genetic mutations have been ruled out. This type of familial cancer is thought to be due to a combination of multiple genetic, environmental, hormonal, and lifestyle factors. While this combination of factors likely increases the risk of cancer, the exact source of this risk is not currently identifiable  or testable.  We discussed that 5 - 10% of breast cancer is hereditary, with most cases associated with BRCA mutations.  There are other genes that can be associated with hereditary breast cancer syndromes.  These include ATM, CHEK2 and PALB2.  We discussed that testing is beneficial for several reasons including knowing how to follow individuals after completing their treatment, identifying whether potential treatment options such as PARP inhibitors would be beneficial, and understand if other family members could be at risk for cancer and allow them to undergo genetic testing.   We reviewed the characteristics, features and inheritance patterns of hereditary cancer syndromes. We also discussed genetic testing, including the appropriate family members to test, the process of testing, insurance coverage and turn-around-time for results. We discussed the implications of a negative, positive, carrier and/or variant of uncertain significant  result. Susan Stevens  was offered a common hereditary cancer panel (47 genes) and an expanded pan-cancer panel (77 genes). Susan Stevens was informed of the benefits and limitations of each panel, including that expanded pan-cancer panels contain genes that do not have clear management guidelines at this point in time.  We also discussed that as the number of genes included on a panel increases, the chances of variants of uncertain significance increases. Susan Stevens decided to pursue genetic testing for the CancerNext-Expanded+RNAinsight gene panel.   The CancerNext-Expanded gene panel offered by [facility] and includes sequencing and rearrangement analysis for the following 77 genes: AIP, ALK, APC*, ATM*, AXIN2, BAP1, BARD1, BLM, BMPR1A, BRCA1*, BRCA2*, BRIP1*, CDC73, CDH1*, CDK4, CDKN1B, CDKN2A, CHEK2*, CTNNA1, DICER1, FANCC, FH, FLCN, GALNT12, KIF1B, LZTR1, MAX, MEN1, MET, MLH1*, MSH2*, MSH3, MSH6*, MUTYH*, NBN, NF1*, NF2, NTHL1, PALB2*, PHOX2B, PMS2*, POT1, PRKAR1A, PTCH1, PTEN*,  RAD51C*, RAD51D*, RB1, RECQL, RET, SDHA, SDHAF2, SDHB, SDHC, SDHD, SMAD4, SMARCA4, SMARCB1, SMARCE1, STK11, SUFU, TMEM127, TP53*, TSC1, TSC2, VHL and XRCC2 (sequencing and deletion/duplication); EGFR, EGLN1, HOXB13, KIT, MITF, PDGFRA, POLD1, and POLE (sequencing only); EPCAM and GREM1 (deletion/duplication only). DNA and RNA analyses performed for * genes.   Based on Susan Stevens's personal and family history of cancer, she meets medical criteria for genetic testing. Despite that she meets criteria, she may still have an out of pocket cost. We discussed that if her out of pocket cost for testing is over $100, the laboratory will call and confirm whether she wants to proceed with testing.  If the out of pocket cost of testing is less than $100 she will be billed by the genetic testing laboratory.   We discussed that some people do not want to undergo genetic testing due to fear of genetic discrimination.  The Genetic Information Nondiscrimination Act (GINA) was signed into federal law in 2008. GINA prohibits health insurers and most employers from discriminating against individuals based on genetic information (including the results of genetic tests and family history information). According to GINA, health insurance companies cannot consider genetic information to be a preexisting condition, nor can they use it to make decisions regarding coverage or rates. GINA also makes it illegal for most employers to use genetic information in making decisions about hiring, firing, promotion, or terms of employment. It is important to note that GINA does not offer protections for life insurance, disability insurance, or long-term care insurance. GINA does not apply to those in the [employer], those who work for companies with less than 15 employees, and new life insurance or long-term disability insurance policies.  Health status due to a cancer diagnosis is not protected under GINA. More information about GINA can be found by  visiting [email or web address].   PLAN: After considering the risks, benefits, and limitations, Susan Stevens provided informed consent to pursue genetic testing and the blood sample was sent to [facility] for analysis of the CancerNext-Expanded+RNAinsight. Results should be available within approximately 2-3 weeks' time, at which point they will be disclosed by telephone to Susan Stevens, as will any additional recommendations warranted by these results. Susan Stevens will receive a summary of her genetic counseling visit and a copy of her results once available. This information will also be available in Epic.   Lastly, we encouraged Susan Stevens to remain in contact with cancer genetics annually so that we can continuously update the family history and inform her of any changes in cancer genetics and testing that may be of benefit for this family.  Susan Stevens questions were answered to her satisfaction today. Our contact information was provided should additional questions or concerns arise. Thank you for the referral and allowing [place] to share in the care of your patient.   Ethelda Deangelo P. Florene Glen, [place], [facility] Licensed, Insurance risk surveyor [email or web address] phone: [phone]  The patient was seen for a total of 35 minutes in face-to-face genetic counseling.  The patient was seen alone.  Drs. Michell Heinrich, and/or Rogers were available for questions, if needed..    _______________________________________________________________________ For Office Staff:  Number of people involved in session: 1 Was an Intern/ student involved with case: yes

## 2023-01-11 NOTE — Progress Notes (Signed)
Radiation Oncology         [phone] ________________________________  Name: Susan Stevens MRN: [id number]  Date: 01/12/2023  DOB: [birth year]  Follow-Up Visit Note  Outpatient  CC: Susan Rea, MD  Susan Lose, MD  Diagnosis:      ICD-10-CM   1. 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     Since her consultation date of 12/12/22, she underwent excision of the right nipple/areola margin on 12/14/22 under the care of Dr. Donne Stevens. 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    Following a detailed discussion with Dr. Lindi Stevens on 12/22/22, the patient has opted to proceed with antiestrogen therapy consisting of tamoxifen (x 5 years) following XRT.  The patient met with genetic counseling on 01/03/23 and has opted to proceed with testing. Results are pending at this time.   Symptomatically, the patient reports:    Lymphedema issues, if any:  Denies    Pain issues, if any: Reports mild tenderness and soreness to breast, but otherwise denies any concerns  SAFETY ISSUES: Prior radiation? No Pacemaker/ICD? No Possible current pregnancy? No Is the patient on methotrexate? No  Current Complaints / other details:  Takes care of her 15 year old grand daughter in the mornings for her daughter       HPI Initial Consultation 12/12/22 ::Susan Stevens is a 67 y.o. female who initially presented for a  routine screening mammogram performed in June/July 2022 which showed calcifications in the right breast. No symptoms, if any, were reported at that time. Diagnostic right breast mammogram on 05/20/2021 redemonstrated several groups of indeterminate calcifications in the upper outer and lateral right breast, measuring 1 cm and 1.4 cm respectively, thought to be secondary to fibrocystic changes (given presence of multiple small bilateral cysts).     Biopsy of the upper outer right breast calcifications on date of 05/26/21 showed flat epithelial atypia and usual ductal hyperplasia with calcifications.     The patient was then referred to Dr. Donne Stevens and opted to proceed with observation and 6 month follow-up imaging vs. an excisional biopsy.     6 month follow-up diagnostic mammogram on 12/13/21 showed stability of the biopsy proven benign right breast calcifications.    Follow-up bilateral diagnostic mammogram and right breast ultrasound on 06/13/22 again showed stability of the right breast calcifications. No abnormalities were seen on the left. At the time of this study, the patient reported one episode of bloody discharge from her right nipple. No mammographic or sonographic findings were seen to explain her bloody discharge.   However, the patient's PCP ordered a bilateral breast MRI on 09/14/22 which revealed an oval mass within the right nipple and extending posteriorly to the retro nipple region measuring 9 x 8 x 4 mm in size. MRI also showed a mildly prominent right level I axillary lymph node, and the multiple previously demonstrated benign bilateral breast cysts.    Ultrasound of the right breast  on 09/28/22 showed no pathologic right axillary lymphadenopathy to correlate with MRI findings. [place] also redemonstrated the mass involving the right nipple measuring 0.9 cm.   Following discussion with Dr. Donne Stevens on 10/24/22, the patient agreed to proceed with coring out of the nipple via  periareolar incision for biopsies of the mass. Pathology from excision of the right breast mass on 11/23/22 showed: tumor the size of 3.5 mm; histology of intermediate grade DCIS involving an intraductal papilloma; anterior margin positive for DCIS. ER status: 90% positive with moderate to strong staining intensity; PR status 70% positive with weak to strong staining intensity; Her2 not assessed.   Per Dr. Donne Stevens, the patient will, at minimum, require removal of both the right nipple and areola. During her most recent follow-up visit with Dr. Donne Stevens on 12/08/22, surgical options including breast conserving surgery vs mastectomy were also discussed with the patient. She will make a final decision regarding surgery based on discussions with Dr. Lindi Stevens and myself.    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  She is scheduled for breast conserving surgery, removal of right nipple and areolar complex, on 12/14/2022.   PREVIOUS RADIATION THERAPY: No        ALLERGIES:  is allergic to phenothiazines.  Meds: Current Outpatient Medications  Medication Sig Dispense Refill   oxybutynin (DITROPAN-XL) 5 MG 24 hr tablet Take 5 mg by mouth at bedtime.     No current facility-administered medications for this encounter.    Physical Findings:  height is 5' 2"$  (1.575 m) and weight is 177 lb 12.8 oz (80.6 kg). Her temperature is 97.7 F (36.5 C). Her blood pressure is 140/79 (abnormal) and her pulse is 83. Her respiration is 20 and oxygen saturation is 100%. .     General: Alert and oriented, in no acute distress HEENT: Head is normocephalic. Extraocular movements are intact.  Heart RRR Chest CTAB Musculoskeletal: symmetric strength and muscle tone throughout.  Good range of motion in shoulders bilaterally Neurologic: No obvious focalities. Speech is fluent.  Psychiatric: Judgment and insight are intact. Affect is  appropriate. Breast exam reveals status post nipple removal, right breast, with satisfactory healing of lumpectomy scar  Lab Findings: No results found for: "WBC", "HGB", "HCT", "MCV", "PLT"  @LASTCHEMISTRY$ @  Radiographic Findings: No results found.  Impression/Plan: We discussed adjuvant radiotherapy today.  I recommend standard hypofractionation to the right breast, 16 treatments, in order to minimize risk of recurrence in the breast.  I reviewed the logistics, benefits, risks, and potential side effects of this treatment in detail. Risks may include but not necessary be limited to acute and late injury tissue in the radiation fields such as skin irritation (change in color/pigmentation, itching, dryness, pain, peeling). She may experience fatigue. We also discussed possible risk of long term cosmetic changes or scar tissue. There is also a smaller risk for lung toxicity, brachial plexopathy, lymphedema, musculoskeletal changes, rib fragility or induction of a second malignancy, late chronic non-healing soft tissue wound.    The patient asked good questions which I answered to her satisfaction. She is enthusiastic about proceeding with treatment. A consent form has been signed and placed in her chart.  We will proceed with treatment planning today and start her treatment in early March   On date of service, in total, I spent 30 minutes on this encounter. Patient was seen in person.  _____________________________________   Eppie Gibson, MD  This document serves as a record of services personally performed by Eppie Gibson, MD. It was created on her behalf by Roney Mans, a trained medical scribe. The creation of this record is based on the scribe's personal observations and the provider's statements to them. This document has been checked and approved by the attending provider.

## 2023-01-11 NOTE — Progress Notes (Signed)
Location of Breast Cancer:  Ductal carcinoma in situ (DCIS) of right breast  Histology per Pathology Report:  12/14/2022 A. NIPPLE AND AREOLA, RIGHT MARGIN, EXCISION:  -  Skin and nipple with underlying lactiferous duct with focal minimal residual intraductal papilloma with squamous metaplasia in the background of extensive surgical type changes including histiocytosis, fat necrosis and chronic inflammatory changes.  -  No residual DCIS is identified in representative sections examined.   11/23/2022 A. NIPPLE, RIGHT MASS, EXCISION:  -  Ductal carcinoma in situ (DCIS), nuclear grade 2 of 3 involving intraductal papilloma.  -  The anterior margin is positive.  -  Prognostic markers pending.   Receptor Status: ER(90%), PR (70%)  Did patient present with symptoms (if so, please note symptoms) or was this found on screening mammography?: from Dr. Geralyn Flash 12/22/22 note: "She had screening mammograms every 6 months because of nipple discharge in the initial ultrasounds did not reveal any abnormalities until the breast MRI on 09/14/2022 was performed which revealed 9 mm mass within the right nipple extending into the retronipple region considerations for papilloma "  Past/Anticipated interventions by surgeon, if any:  Reports she saw Dr. Donne Hazel yesterday 01/11/2023  12/14/2022 --Dr. Rolm Bookbinder Right nipple and areola excision (margin re-excision)   11/23/2022 --Dr. Rolm Bookbinder Right nipple mass excision   Past/Anticipated interventions by medical oncology, if any:  Under care of Dr. Retta Diones 12/22/2022 --Recommendation: Adjuvant radiation therapy Followed by antiestrogen therapy with tamoxifen 5 years --Return to clinic after radiation to start tamoxifen. --There is breast cancer in the father side of the family.   Therefore I will refer her to genetic counseling.  Lymphedema issues, if any:  Denies    Pain issues, if any: Reports mild tenderness and soreness to  breast, but otherwise denies any concerns  SAFETY ISSUES: Prior radiation? No Pacemaker/ICD? No Possible current pregnancy? No Is the patient on methotrexate? No  Current Complaints / other details:  Takes care of her 20 year old grand daughter in the mornings for her daughter

## 2023-01-12 ENCOUNTER — Ambulatory Visit
Admission: RE | Admit: 2023-01-12 | Discharge: 2023-01-12 | Disposition: A | Discharge status: Home / Self Care | Payer: Medicare Other | Source: Ambulatory Visit | Attending: Radiation Oncology | Admitting: Radiation Oncology

## 2023-01-12 ENCOUNTER — Encounter: Payer: Self-pay | Admitting: Radiation Oncology

## 2023-01-12 VITALS — BP 140/79 | HR 83 | Temp 97.7°F | Resp 20 | Ht 62.0 in | Wt 177.8 lb

## 2023-01-12 DIAGNOSIS — D0511 Intraductal carcinoma in situ of right breast: Secondary | ICD-10-CM

## 2023-01-12 DIAGNOSIS — Z79899 Other long term (current) drug therapy: Secondary | ICD-10-CM | POA: Diagnosis not present

## 2023-01-16 ENCOUNTER — Encounter: Payer: Self-pay | Admitting: Genetic Counselor

## 2023-01-16 ENCOUNTER — Ambulatory Visit: Payer: Self-pay | Admitting: Genetic Counselor

## 2023-01-16 ENCOUNTER — Telehealth: Payer: Self-pay | Admitting: Genetic Counselor

## 2023-01-16 DIAGNOSIS — Z1379 Encounter for other screening for genetic and chromosomal anomalies: Secondary | ICD-10-CM | POA: Insufficient documentation

## 2023-01-16 DIAGNOSIS — D0511 Intraductal carcinoma in situ of right breast: Secondary | ICD-10-CM | POA: Diagnosis not present

## 2023-01-16 NOTE — Progress Notes (Signed)
HPI:  Susan Stevens was previously seen in the [place] clinic due to a personal and family history of breast cancer and concerns regarding a hereditary predisposition to cancer. Please refer to our prior cancer genetics clinic note for more information regarding our discussion, assessment and recommendations, at the time. Susan Stevens recent genetic test results were disclosed to her, as were recommendations warranted by these results. These results and recommendations are discussed in more detail below.  CANCER HISTORY:  Oncology History  Ductal carcinoma in situ (DCIS) of right breast  11/23/2022 Initial Diagnosis   Breast MRI detected 9 mm retroareolar right breast mass, excision revealed DCIS grade 2 involving intraductal papilloma, anterior margin was positive, resection of the margins on 12/14/2022: No further DCIS and margins were clear.  ER 90%, PR 70%   01/15/2023 Genetic Testing   Negative genetic testing on the CancerNext-Expanded+RNAinsight panel.  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  The report date is January 15, 2023.  The CancerNext-Expanded gene panel offered by [facility] and includes sequencing and rearrangement analysis for the following 77 genes: AIP, ALK, APC*, ATM*, AXIN2, BAP1, BARD1, BLM, BMPR1A, BRCA1*, BRCA2*, BRIP1*, CDC73, CDH1*, CDK4, CDKN1B, CDKN2A, CHEK2*, CTNNA1, DICER1, FANCC, FH, FLCN, GALNT12, KIF1B, LZTR1, MAX, MEN1, MET, MLH1*, MSH2*, MSH3, MSH6*, MUTYH*, NBN, NF1*, NF2, NTHL1, PALB2*, PHOX2B, PMS2*, POT1, PRKAR1A, PTCH1, PTEN*, RAD51C*, RAD51D*, RB1, RECQL, RET, SDHA, SDHAF2, SDHB, SDHC, SDHD, SMAD4, SMARCA4, SMARCB1, SMARCE1, STK11, SUFU, TMEM127, TP53*, TSC1, TSC2, VHL and XRCC2 (sequencing and deletion/duplication); EGFR, EGLN1, HOXB13, KIT, MITF, PDGFRA, POLD1, and POLE (sequencing only); EPCAM and GREM1 (deletion/duplication only). DNA and RNA analyses performed for * genes.      FAMILY HISTORY:  We obtained a detailed, 4-generation family  history.  Significant diagnoses are listed below: Family History  Problem Relation Age of Onset   Memory loss Mother    Breast cancer Mother        ?? calcifications   Lung cancer Father    Lung cancer Paternal Grandmother    Lung cancer Paternal Grandfather    Lung cancer Paternal Aunt    Dementia Maternal Aunt    Dementia Maternal Uncle        The patient has three sons and a daughter.  One son is adopted.  None have cancer.  She has two brothers, one who had skin cancer.  Her mother is living and her father is deceased.   The patient's mother had a lumpectomy for breast calcifications.  Her two siblings were cancer free.  There is no other reported family history of cancer.   The patient's father died of lung cancer.  He had one sister who had breast cancer.  The maternal grandparents are deceased from lung cancer.  The grandfather had a sister with breast cancer, a brother with stomach cancer and a niece with breast cancer.   Susan Stevens is unaware of previous family history of genetic testing for hereditary cancer risks. Patient's maternal ancestors are of Caucasian descent, and paternal ancestors are of Caucasian descent. There is no reported Ashkenazi Jewish ancestry. There is no known consanguinity.  GENETIC TEST RESULTS: Genetic testing reported out on January 15, 2023 through the CancerNext-Expanded+RNAinsight cancer panel found no pathogenic mutations. The CancerNext-Expanded gene panel offered by [facility] and includes sequencing and rearrangement analysis for the following 77 genes: AIP, ALK, APC*, ATM*, AXIN2, BAP1, BARD1, BLM, BMPR1A, BRCA1*, BRCA2*, BRIP1*, CDC73, CDH1*, CDK4, CDKN1B, CDKN2A, CHEK2*, CTNNA1, DICER1, FANCC, FH, FLCN, GALNT12, KIF1B, LZTR1,  MAX, MEN1, MET, MLH1*, MSH2*, MSH3, MSH6*, MUTYH*, NBN, NF1*, NF2, NTHL1, PALB2*, PHOX2B, PMS2*, POT1, PRKAR1A, PTCH1, PTEN*, RAD51C*, RAD51D*, RB1, RECQL, RET, SDHA, SDHAF2, SDHB, SDHC, SDHD, SMAD4, SMARCA4, SMARCB1,  SMARCE1, STK11, SUFU, TMEM127, TP53*, TSC1, TSC2, VHL and XRCC2 (sequencing and deletion/duplication); EGFR, EGLN1, HOXB13, KIT, MITF, PDGFRA, POLD1, and POLE (sequencing only); EPCAM and GREM1 (deletion/duplication only). DNA and RNA analyses performed for * genes. The test report has been scanned into EPIC and is located under the Molecular Pathology section of the Results Review tab.  A portion of the result report is included below for reference.     We discussed with Susan Stevens that because current genetic testing is not perfect, it is possible there may be a gene mutation in one of these genes that current testing cannot detect, but that chance is small.  We also discussed, that there could be another gene that has not yet been discovered, or that we have not yet tested, that is responsible for the cancer diagnoses in the family. It is also possible there is a hereditary cause for the cancer in the family that Susan Stevens did not inherit and therefore was not identified in her testing.  Therefore, it is important to remain in touch with cancer genetics in the future so that we can continue to offer Susan Stevens the most up to date genetic testing.   Genetic testing did identify a variant of uncertain significance (VUS) was identified in the BRIP1 gene called c.413T>C.  At this time, it is unknown if this variant is associated with increased cancer risk or if this is a normal finding, but most variants such as this get reclassified to being inconsequential. It should not be used to make medical management decisions. With time, we suspect the lab will determine the significance of this variant, if any. If we do learn more about it, we will try to contact Susan Stevens to discuss it further. However, it is important to stay in touch with [place] periodically and keep the address and phone number up to date.  ADDITIONAL GENETIC TESTING: We discussed with Susan Stevens that her genetic testing was fairly extensive.  If there are  genes identified to increase cancer risk that can be analyzed in the future, we would be happy to discuss and coordinate this testing at that time.    CANCER SCREENING RECOMMENDATIONS: Susan Stevens test result is considered negative (normal).  This means that we have not identified a hereditary cause for her personal and family history of breast cancer at this time. Most cancers happen by chance and this negative test suggests that her cancer may fall into this category.    While reassuring, this does not definitively rule out a hereditary predisposition to cancer. It is still possible that there could be genetic mutations that are undetectable by current technology. There could be genetic mutations in genes that have not been tested or identified to increase cancer risk.  Therefore, it is recommended she continue to follow the cancer management and screening guidelines provided by her oncology and primary healthcare provider.   An individual's cancer risk and medical management are not determined by genetic test results alone. Overall cancer risk assessment incorporates additional factors, including personal medical history, family history, and any available genetic information that may result in a personalized plan for cancer prevention and surveillance  RECOMMENDATIONS FOR FAMILY MEMBERS:  Individuals in this family might be at some increased risk of developing cancer, over the general population  risk, simply due to the family history of cancer.  We recommended women in this family have a yearly mammogram beginning at age 38, or 69 years younger than the earliest onset of cancer, an annual clinical breast exam, and perform monthly breast self-exams. Women in this family should also have a gynecological exam as recommended by their primary provider. All family members should be referred for colonoscopy starting at age 4.  FOLLOW-UP: Lastly, we discussed with Susan Stevens that cancer genetics is a rapidly  advancing field and it is possible that new genetic tests will be appropriate for her and/or her family members in the future. We encouraged her to remain in contact with cancer genetics on an annual basis so we can update her personal and family histories and let her know of advances in cancer genetics that may benefit this family.   Our contact number was provided. Susan Stevens questions were answered to her satisfaction, and she knows she is welcome to call us at anytime with additional questions or concerns.   Roma Kayser, [place], [facility] Licensed, Certified Genetic Counselor [email or web address]

## 2023-01-16 NOTE — Telephone Encounter (Signed)
Revealed negative genetic testing.  Discussed that we do not know why she has breast cancer or why there is cancer in the family. It could be due to a different gene that we are not testing, or maybe our current technology may not be able to pick something up.  It will be important for her to keep in contact with genetics to keep up with whether additional testing may be needed.   One VUS in BRIP1 that will not change medical management was identified.

## 2023-01-24 ENCOUNTER — Other Ambulatory Visit: Payer: Self-pay

## 2023-01-24 ENCOUNTER — Ambulatory Visit
Admission: RE | Admit: 2023-01-24 | Discharge: 2023-01-24 | Disposition: A | Discharge status: Home / Self Care | Payer: Medicare Other | Source: Ambulatory Visit | Attending: Radiation Oncology | Admitting: Radiation Oncology

## 2023-01-24 DIAGNOSIS — D0511 Intraductal carcinoma in situ of right breast: Secondary | ICD-10-CM | POA: Insufficient documentation

## 2023-01-24 LAB — RAD ONC ARIA SESSION SUMMARY
Course Elapsed Days: 0
Plan Fractions Treated to Date: 1
Plan Prescribed Dose Per Fraction: 2.66 Gy
Plan Total Fractions Prescribed: 16
Plan Total Prescribed Dose: 42.56 Gy
Reference Point Dosage Given to Date: 2.66 Gy
Reference Point Session Dosage Given: 2.66 Gy
Session Number: 1

## 2023-01-25 ENCOUNTER — Other Ambulatory Visit: Payer: Self-pay

## 2023-01-25 ENCOUNTER — Ambulatory Visit
Admission: RE | Admit: 2023-01-25 | Discharge: 2023-01-25 | Disposition: A | Discharge status: Home / Self Care | Payer: Medicare Other | Source: Ambulatory Visit | Attending: Radiation Oncology | Admitting: Radiation Oncology

## 2023-01-25 DIAGNOSIS — D0511 Intraductal carcinoma in situ of right breast: Secondary | ICD-10-CM | POA: Diagnosis not present

## 2023-01-25 LAB — RAD ONC ARIA SESSION SUMMARY
Course Elapsed Days: 1
Plan Fractions Treated to Date: 2
Plan Prescribed Dose Per Fraction: 2.66 Gy
Plan Total Fractions Prescribed: 16
Plan Total Prescribed Dose: 42.56 Gy
Reference Point Dosage Given to Date: 5.32 Gy
Reference Point Session Dosage Given: 2.66 Gy
Session Number: 2

## 2023-01-26 ENCOUNTER — Ambulatory Visit
Admission: RE | Admit: 2023-01-26 | Discharge: 2023-01-26 | Disposition: A | Discharge status: Home / Self Care | Payer: Medicare Other | Source: Ambulatory Visit | Attending: Radiation Oncology | Admitting: Radiation Oncology

## 2023-01-26 ENCOUNTER — Other Ambulatory Visit: Payer: Self-pay

## 2023-01-26 DIAGNOSIS — D0511 Intraductal carcinoma in situ of right breast: Secondary | ICD-10-CM | POA: Diagnosis not present

## 2023-01-26 LAB — RAD ONC ARIA SESSION SUMMARY
Course Elapsed Days: 2
Plan Fractions Treated to Date: 3
Plan Prescribed Dose Per Fraction: 2.66 Gy
Plan Total Fractions Prescribed: 16
Plan Total Prescribed Dose: 42.56 Gy
Reference Point Dosage Given to Date: 7.98 Gy
Reference Point Session Dosage Given: 2.66 Gy
Session Number: 3

## 2023-01-29 ENCOUNTER — Ambulatory Visit: Admission: RE | Admit: 2023-01-29 | Payer: Medicare Other | Source: Ambulatory Visit

## 2023-01-29 ENCOUNTER — Ambulatory Visit: Payer: Medicare Other

## 2023-01-30 ENCOUNTER — Ambulatory Visit
Admission: RE | Admit: 2023-01-30 | Discharge: 2023-01-30 | Disposition: A | Discharge status: Home / Self Care | Payer: Medicare Other | Source: Ambulatory Visit | Attending: Radiation Oncology | Admitting: Radiation Oncology

## 2023-01-30 ENCOUNTER — Telehealth: Payer: Self-pay

## 2023-01-30 ENCOUNTER — Encounter: Payer: Self-pay | Admitting: Hematology and Oncology

## 2023-01-30 ENCOUNTER — Other Ambulatory Visit: Payer: Self-pay

## 2023-01-30 DIAGNOSIS — D0511 Intraductal carcinoma in situ of right breast: Secondary | ICD-10-CM | POA: Diagnosis not present

## 2023-01-30 LAB — RAD ONC ARIA SESSION SUMMARY
Course Elapsed Days: 6
Plan Fractions Treated to Date: 4
Plan Prescribed Dose Per Fraction: 2.66 Gy
Plan Total Fractions Prescribed: 16
Plan Total Prescribed Dose: 42.56 Gy
Reference Point Dosage Given to Date: 10.64 Gy
Reference Point Session Dosage Given: 2.66 Gy
Session Number: 4

## 2023-01-30 MED ORDER — RADIAPLEXRX EX GEL: Freq: Once | CUTANEOUS | Status: AC

## 2023-01-30 MED ORDER — ALRA NON-METALLIC DEODORANT (RAD-ONC)
1.0000 | Freq: Once | TOPICAL | Status: AC
  Administered 2023-01-30: 1 via TOPICAL

## 2023-01-30 NOTE — Progress Notes (Signed)
Pt here for patient teaching. Pt given Radiation and You booklet, skin care instructions, Alra deodorant, and Radiaplex gel. Reviewed areas of pertinence such as fatigue, hair loss, nausea and vomiting, skin changes, breast tenderness, breast swelling, and taste changes. Pt able to give teach back of to pat skin, use unscented/gentle soap, and drink plenty of water, apply Radiaplex bid, avoid applying anything to skin within 4 hours of treatment, avoid wearing an under wire bra, and to use an electric razor if they must shave. Pt verbalizes understanding of information given and will contact nursing with any questions or concerns.     Http://[email or web address]/treatmentinformation/whattoexpect/index      

## 2023-01-30 NOTE — Telephone Encounter (Signed)
Rn Susan Stevens called pt back to inform her that she would need to call the billing department to get the UB04 forms she is requesting for her aflac cancer policy. Pt stated she would look into this and was grateful for the information.

## 2023-01-31 ENCOUNTER — Ambulatory Visit
Admission: RE | Admit: 2023-01-31 | Discharge: 2023-01-31 | Disposition: A | Discharge status: Home / Self Care | Payer: Medicare Other | Source: Ambulatory Visit | Attending: Radiation Oncology | Admitting: Radiation Oncology

## 2023-01-31 ENCOUNTER — Other Ambulatory Visit: Payer: Self-pay

## 2023-01-31 DIAGNOSIS — D0511 Intraductal carcinoma in situ of right breast: Secondary | ICD-10-CM | POA: Diagnosis not present

## 2023-01-31 LAB — RAD ONC ARIA SESSION SUMMARY
Course Elapsed Days: 7
Plan Fractions Treated to Date: 5
Plan Prescribed Dose Per Fraction: 2.66 Gy
Plan Total Fractions Prescribed: 16
Plan Total Prescribed Dose: 42.56 Gy
Reference Point Dosage Given to Date: 13.3 Gy
Reference Point Session Dosage Given: 2.66 Gy
Session Number: 5

## 2023-02-01 ENCOUNTER — Other Ambulatory Visit: Payer: Self-pay

## 2023-02-01 ENCOUNTER — Ambulatory Visit
Admission: RE | Admit: 2023-02-01 | Discharge: 2023-02-01 | Disposition: A | Discharge status: Home / Self Care | Payer: Medicare Other | Source: Ambulatory Visit | Attending: Radiation Oncology | Admitting: Radiation Oncology

## 2023-02-01 DIAGNOSIS — D0511 Intraductal carcinoma in situ of right breast: Secondary | ICD-10-CM | POA: Diagnosis not present

## 2023-02-01 LAB — RAD ONC ARIA SESSION SUMMARY
Course Elapsed Days: 8
Plan Fractions Treated to Date: 6
Plan Prescribed Dose Per Fraction: 2.66 Gy
Plan Total Fractions Prescribed: 16
Plan Total Prescribed Dose: 42.56 Gy
Reference Point Dosage Given to Date: 15.96 Gy
Reference Point Session Dosage Given: 2.66 Gy
Session Number: 6

## 2023-02-02 ENCOUNTER — Encounter: Payer: Self-pay | Admitting: *Deleted

## 2023-02-02 ENCOUNTER — Ambulatory Visit
Admission: RE | Admit: 2023-02-02 | Discharge: 2023-02-02 | Disposition: A | Discharge status: Home / Self Care | Payer: Medicare Other | Source: Ambulatory Visit | Attending: Radiation Oncology | Admitting: Radiation Oncology

## 2023-02-02 ENCOUNTER — Other Ambulatory Visit: Payer: Self-pay

## 2023-02-02 DIAGNOSIS — D0511 Intraductal carcinoma in situ of right breast: Secondary | ICD-10-CM | POA: Diagnosis not present

## 2023-02-02 LAB — RAD ONC ARIA SESSION SUMMARY
Course Elapsed Days: 9
Plan Fractions Treated to Date: 7
Plan Prescribed Dose Per Fraction: 2.66 Gy
Plan Total Fractions Prescribed: 16
Plan Total Prescribed Dose: 42.56 Gy
Reference Point Dosage Given to Date: 18.62 Gy
Reference Point Session Dosage Given: 2.66 Gy
Session Number: 7

## 2023-02-05 ENCOUNTER — Ambulatory Visit: Admission: RE | Admit: 2023-02-05 | Payer: Medicare Other | Source: Ambulatory Visit

## 2023-02-06 ENCOUNTER — Other Ambulatory Visit: Payer: Self-pay

## 2023-02-06 ENCOUNTER — Ambulatory Visit
Admission: RE | Admit: 2023-02-06 | Discharge: 2023-02-06 | Disposition: A | Discharge status: Home / Self Care | Payer: Medicare Other | Source: Ambulatory Visit | Attending: Radiation Oncology | Admitting: Radiation Oncology

## 2023-02-06 DIAGNOSIS — D0511 Intraductal carcinoma in situ of right breast: Secondary | ICD-10-CM | POA: Diagnosis not present

## 2023-02-06 LAB — RAD ONC ARIA SESSION SUMMARY
Course Elapsed Days: 13
Plan Fractions Treated to Date: 8
Plan Prescribed Dose Per Fraction: 2.66 Gy
Plan Total Fractions Prescribed: 16
Plan Total Prescribed Dose: 42.56 Gy
Reference Point Dosage Given to Date: 21.28 Gy
Reference Point Session Dosage Given: 2.66 Gy
Session Number: 8

## 2023-02-07 ENCOUNTER — Other Ambulatory Visit: Payer: Self-pay

## 2023-02-07 ENCOUNTER — Ambulatory Visit
Admission: RE | Admit: 2023-02-07 | Discharge: 2023-02-07 | Disposition: A | Discharge status: Home / Self Care | Payer: Medicare Other | Source: Ambulatory Visit | Attending: Radiation Oncology | Admitting: Radiation Oncology

## 2023-02-07 DIAGNOSIS — D0511 Intraductal carcinoma in situ of right breast: Secondary | ICD-10-CM | POA: Diagnosis not present

## 2023-02-07 LAB — RAD ONC ARIA SESSION SUMMARY
Course Elapsed Days: 14
Plan Fractions Treated to Date: 9
Plan Prescribed Dose Per Fraction: 2.66 Gy
Plan Total Fractions Prescribed: 16
Plan Total Prescribed Dose: 42.56 Gy
Reference Point Dosage Given to Date: 23.94 Gy
Reference Point Session Dosage Given: 2.66 Gy
Session Number: 9

## 2023-02-08 ENCOUNTER — Other Ambulatory Visit: Payer: Self-pay

## 2023-02-08 ENCOUNTER — Ambulatory Visit
Admission: RE | Admit: 2023-02-08 | Discharge: 2023-02-08 | Disposition: A | Discharge status: Home / Self Care | Payer: Medicare Other | Source: Ambulatory Visit | Attending: Radiation Oncology | Admitting: Radiation Oncology

## 2023-02-08 DIAGNOSIS — D0511 Intraductal carcinoma in situ of right breast: Secondary | ICD-10-CM | POA: Diagnosis not present

## 2023-02-08 LAB — RAD ONC ARIA SESSION SUMMARY
Course Elapsed Days: 15
Plan Fractions Treated to Date: 10
Plan Prescribed Dose Per Fraction: 2.66 Gy
Plan Total Fractions Prescribed: 16
Plan Total Prescribed Dose: 42.56 Gy
Reference Point Dosage Given to Date: 26.6 Gy
Reference Point Session Dosage Given: 2.66 Gy
Session Number: 10

## 2023-02-09 ENCOUNTER — Inpatient Hospital Stay (HOSPITAL_BASED_OUTPATIENT_CLINIC_OR_DEPARTMENT_OTHER): Payer: Medicare Other | Admitting: Hematology and Oncology

## 2023-02-09 ENCOUNTER — Other Ambulatory Visit: Payer: Self-pay

## 2023-02-09 ENCOUNTER — Ambulatory Visit
Admission: RE | Admit: 2023-02-09 | Discharge: 2023-02-09 | Disposition: A | Discharge status: Home / Self Care | Payer: Medicare Other | Source: Ambulatory Visit | Attending: Radiation Oncology | Admitting: Radiation Oncology

## 2023-02-09 VITALS — BP 131/85 | HR 101 | Temp 97.3°F | Resp 18 | Ht 62.0 in | Wt 178.3 lb

## 2023-02-09 DIAGNOSIS — Z79899 Other long term (current) drug therapy: Secondary | ICD-10-CM | POA: Insufficient documentation

## 2023-02-09 DIAGNOSIS — D0511 Intraductal carcinoma in situ of right breast: Secondary | ICD-10-CM

## 2023-02-09 DIAGNOSIS — R5383 Other fatigue: Secondary | ICD-10-CM | POA: Insufficient documentation

## 2023-02-09 LAB — RAD ONC ARIA SESSION SUMMARY
Course Elapsed Days: 16
Plan Fractions Treated to Date: 11
Plan Prescribed Dose Per Fraction: 2.66 Gy
Plan Total Fractions Prescribed: 16
Plan Total Prescribed Dose: 42.56 Gy
Reference Point Dosage Given to Date: 29.26 Gy
Reference Point Session Dosage Given: 2.66 Gy
Session Number: 11

## 2023-02-09 MED ORDER — TAMOXIFEN CITRATE 10 MG PO TABS: 5.0000 mg | ORAL_TABLET | Freq: Two times a day (BID) | ORAL | 1 refills | Status: DC

## 2023-02-09 NOTE — Assessment & Plan Note (Addendum)
12/03/2022: Right nipple excision: DCIS grade 2 involving intraductal papilloma, anterior margin positive, ER 90%, PR 70% 12/14/2022: Nipple and areola right margin excision: Focal minimal residual intraductal papilloma with squamous metaplasia, no residual DCIS  Recommendation: 1. adjuvant radiation therapy 01/25/2023-02/16/2023 2. Followed by antiestrogen therapy with tamoxifen 5 years I recommended 5 mg dosage based upon 1001 clinical trial   Patient's sister-in-law Jana Half is a patient of mine.  Return to clinic in 3 months for survivorship care plan visit

## 2023-02-09 NOTE — Progress Notes (Signed)
Patient Care Team: Vania Rea, MD as PCP - General (Obstetrics and Gynecology) Mauro Kaufmann, RN as Oncology Nurse Navigator Rockwell Germany, RN as Oncology Nurse Navigator Nicholas Lose, MD as Consulting Physician (Hematology and Oncology)  DIAGNOSIS:  Encounter Diagnosis  Name Primary?   Ductal carcinoma in situ (DCIS) of right breast Yes    SUMMARY OF ONCOLOGIC HISTORY: Oncology History  Ductal carcinoma in situ (DCIS) of right breast  11/23/2022 Initial Diagnosis   Breast MRI detected 9 mm retroareolar right breast mass, excision revealed DCIS grade 2 involving intraductal papilloma, anterior margin was positive, resection of the margins on 12/14/2022: No further DCIS and margins were clear.  ER 90%, PR 70%   01/15/2023 Genetic Testing   Negative genetic testing on the CancerNext-Expanded+RNAinsight panel.  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  The report date is January 15, 2023.  The CancerNext-Expanded gene panel offered by [facility] and includes sequencing and rearrangement analysis for the following 77 genes: AIP, ALK, APC*, ATM*, AXIN2, BAP1, BARD1, BLM, BMPR1A, BRCA1*, BRCA2*, BRIP1*, CDC73, CDH1*, CDK4, CDKN1B, CDKN2A, CHEK2*, CTNNA1, DICER1, FANCC, FH, FLCN, GALNT12, KIF1B, LZTR1, MAX, MEN1, MET, MLH1*, MSH2*, MSH3, MSH6*, MUTYH*, NBN, NF1*, NF2, NTHL1, PALB2*, PHOX2B, PMS2*, POT1, PRKAR1A, PTCH1, PTEN*, RAD51C*, RAD51D*, RB1, RECQL, RET, SDHA, SDHAF2, SDHB, SDHC, SDHD, SMAD4, SMARCA4, SMARCB1, SMARCE1, STK11, SUFU, TMEM127, TP53*, TSC1, TSC2, VHL and XRCC2 (sequencing and deletion/duplication); EGFR, EGLN1, HOXB13, KIT, MITF, PDGFRA, POLD1, and POLE (sequencing only); EPCAM and GREM1 (deletion/duplication only). DNA and RNA analyses performed for * genes.      CHIEF COMPLIANT: Follow-up after radiation  INTERVAL HISTORY: HIILANI HAISTEN is a 67 y.o. female is here because of recent diagnosis of right breast DCIS. She presents to the clinic for a follow-up after  radiation. She states radiation is going fine, she just feel a lot more fatigue.  ALLERGIES:  is allergic to phenothiazines.  MEDICATIONS:  Current Outpatient Medications  Medication Sig Dispense Refill   oxybutynin (DITROPAN-XL) 5 MG 24 hr tablet Take 5 mg by mouth at bedtime.     tamoxifen (NOLVADEX) 10 MG tablet Take 0.5 tablets (5 mg total) by mouth 2 (two) times daily. 90 tablet 1   No current facility-administered medications for this visit.    PHYSICAL EXAMINATION: ECOG PERFORMANCE STATUS: 1 - Symptomatic but completely ambulatory  Vitals:   02/09/23 1004  BP: 131/85  Pulse: (!) 101  Resp: 18  Temp: (!) 97.3 F (36.3 C)  SpO2: 98%   Filed Weights   02/09/23 1004  Weight: 178 lb 4.8 oz (80.9 kg)       ASSESSMENT & PLAN:  Ductal carcinoma in situ (DCIS) of right breast 12/03/2022: Right nipple excision: DCIS grade 2 involving intraductal papilloma, anterior margin positive, ER 90%, PR 70% 12/14/2022: Nipple and areola right margin excision: Focal minimal residual intraductal papilloma with squamous metaplasia, no residual DCIS  Recommendation: 1. adjuvant radiation therapy 01/25/2023-02/16/2023 2. Followed by antiestrogen therapy with tamoxifen 5 years I recommended 5 mg dosage based upon 1001 clinical trial   Patient's sister-in-law Jana Half is a patient of mine.  Return to clinic in 3 months for survivorship care plan visit   No orders of the defined types were placed in this encounter.  The patient has a good understanding of the overall plan. she agrees with it. she will call with any problems that may develop before the next visit here. Total time spent: 30 mins including face to face time and time spent for  planning, charting and co-ordination of care   Harriette Ohara, MD 02/09/23    I Gardiner Coins am acting as a Education administrator for [employer]  I have reviewed the above documentation for accuracy and completeness, and I agree with the above.

## 2023-02-12 ENCOUNTER — Other Ambulatory Visit: Payer: Self-pay

## 2023-02-12 ENCOUNTER — Ambulatory Visit
Admission: RE | Admit: 2023-02-12 | Discharge: 2023-02-12 | Disposition: A | Discharge status: Home / Self Care | Payer: Medicare Other | Source: Ambulatory Visit | Attending: Radiation Oncology | Admitting: Radiation Oncology

## 2023-02-12 DIAGNOSIS — D0511 Intraductal carcinoma in situ of right breast: Secondary | ICD-10-CM | POA: Diagnosis not present

## 2023-02-12 LAB — RAD ONC ARIA SESSION SUMMARY
Course Elapsed Days: 19
Plan Fractions Treated to Date: 12
Plan Prescribed Dose Per Fraction: 2.66 Gy
Plan Total Fractions Prescribed: 16
Plan Total Prescribed Dose: 42.56 Gy
Reference Point Dosage Given to Date: 31.92 Gy
Reference Point Session Dosage Given: 2.66 Gy
Session Number: 12

## 2023-02-13 ENCOUNTER — Other Ambulatory Visit: Payer: Self-pay

## 2023-02-13 ENCOUNTER — Ambulatory Visit
Admission: RE | Admit: 2023-02-13 | Discharge: 2023-02-13 | Disposition: A | Discharge status: Home / Self Care | Payer: Medicare Other | Source: Ambulatory Visit | Attending: Radiation Oncology | Admitting: Radiation Oncology

## 2023-02-13 DIAGNOSIS — D0511 Intraductal carcinoma in situ of right breast: Secondary | ICD-10-CM | POA: Diagnosis not present

## 2023-02-13 LAB — RAD ONC ARIA SESSION SUMMARY
Course Elapsed Days: 20
Plan Fractions Treated to Date: 13
Plan Prescribed Dose Per Fraction: 2.66 Gy
Plan Total Fractions Prescribed: 16
Plan Total Prescribed Dose: 42.56 Gy
Reference Point Dosage Given to Date: 34.58 Gy
Reference Point Session Dosage Given: 2.66 Gy
Session Number: 13

## 2023-02-14 ENCOUNTER — Encounter: Payer: Self-pay | Admitting: *Deleted

## 2023-02-14 ENCOUNTER — Ambulatory Visit: Payer: Medicare Other

## 2023-02-14 ENCOUNTER — Other Ambulatory Visit: Payer: Self-pay

## 2023-02-14 ENCOUNTER — Ambulatory Visit
Admission: RE | Admit: 2023-02-14 | Discharge: 2023-02-14 | Disposition: A | Discharge status: Home / Self Care | Payer: Medicare Other | Source: Ambulatory Visit | Attending: Radiation Oncology | Admitting: Radiation Oncology

## 2023-02-14 DIAGNOSIS — D0511 Intraductal carcinoma in situ of right breast: Secondary | ICD-10-CM

## 2023-02-14 LAB — RAD ONC ARIA SESSION SUMMARY
Course Elapsed Days: 21
Plan Fractions Treated to Date: 14
Plan Prescribed Dose Per Fraction: 2.66 Gy
Plan Total Fractions Prescribed: 16
Plan Total Prescribed Dose: 42.56 Gy
Reference Point Dosage Given to Date: 37.24 Gy
Reference Point Session Dosage Given: 2.66 Gy
Session Number: 14

## 2023-02-15 ENCOUNTER — Other Ambulatory Visit: Payer: Self-pay

## 2023-02-15 ENCOUNTER — Ambulatory Visit: Payer: Medicare Other

## 2023-02-15 ENCOUNTER — Ambulatory Visit
Admission: RE | Admit: 2023-02-15 | Discharge: 2023-02-15 | Disposition: A | Discharge status: Home / Self Care | Payer: Medicare Other | Source: Ambulatory Visit | Attending: Radiation Oncology | Admitting: Radiation Oncology

## 2023-02-15 DIAGNOSIS — D0511 Intraductal carcinoma in situ of right breast: Secondary | ICD-10-CM | POA: Diagnosis not present

## 2023-02-15 LAB — RAD ONC ARIA SESSION SUMMARY
Course Elapsed Days: 22
Plan Fractions Treated to Date: 15
Plan Prescribed Dose Per Fraction: 2.66 Gy
Plan Total Fractions Prescribed: 16
Plan Total Prescribed Dose: 42.56 Gy
Reference Point Dosage Given to Date: 39.9 Gy
Reference Point Session Dosage Given: 2.66 Gy
Session Number: 15

## 2023-02-16 ENCOUNTER — Ambulatory Visit
Admission: RE | Admit: 2023-02-16 | Discharge: 2023-02-16 | Disposition: A | Discharge status: Home / Self Care | Payer: Medicare Other | Source: Ambulatory Visit | Attending: Radiation Oncology | Admitting: Radiation Oncology

## 2023-02-16 ENCOUNTER — Other Ambulatory Visit: Payer: Self-pay

## 2023-02-16 DIAGNOSIS — D0511 Intraductal carcinoma in situ of right breast: Secondary | ICD-10-CM | POA: Diagnosis not present

## 2023-02-16 LAB — RAD ONC ARIA SESSION SUMMARY
Course Elapsed Days: 23
Plan Fractions Treated to Date: 16
Plan Prescribed Dose Per Fraction: 2.66 Gy
Plan Total Fractions Prescribed: 16
Plan Total Prescribed Dose: 42.56 Gy
Reference Point Dosage Given to Date: 42.56 Gy
Reference Point Session Dosage Given: 2.66 Gy
Session Number: 16

## 2023-02-19 NOTE — Radiation Completion Notes (Signed)
Patient Name: Susan Stevens, WILBUR MRN: [id number] Date of Birth: 05-May-1956 Referring Physician: Nicholas Lose, M.D. Date of Service: 2023-02-19 Radiation Oncologist: Eppie Gibson, M.D. [facility] END OF TREATMENT NOTE     Diagnosis: D05.11 Intraductal carcinoma in situ of right breast Intent: Curative     ==========DELIVERED PLANS==========  First Treatment Date: 2023-01-24 - Last Treatment Date: 2023-02-16   Plan Name: Breast_R Site: Breast, Right Technique: 3D Mode: Photon Dose Per Fraction: 2.66 Gy Prescribed Dose (Delivered / Prescribed): 42.56 Gy / 42.56 Gy Prescribed Fxs (Delivered / Prescribed): 16 / 16     ==========ON TREATMENT VISIT DATES========== 2023-01-30, 2023-02-06, 2023-02-12     ==========UPCOMING VISITS==========       ==========APPENDIX - ON TREATMENT VISIT NOTES==========   See weekly On Treatment Notes is Epic for details.

## 2023-02-25 ENCOUNTER — Encounter: Payer: Self-pay | Admitting: Hematology and Oncology

## 2023-03-09 NOTE — Progress Notes (Incomplete)
Susan Stevens was called today for follow-up after completing radiation to her right breast on 02-16-23.  Pain: Intermittent sharp, shooting pain improving, some tenderness also remains Skin: healing well, tan in color, pt educated on use of vitamin E oil, also continues to use Radioplex ROM: normal ROM Fatigue: reports moderate fatigue still but also reports increased stress. She has an increased work load as she is getting ready to retire. She also has a big family as well. Rn encouraged pt to rest when possible and not over do it.  Lymphedema: none to report MedOnc F/U: Lillard Anes 05-14-23 Other issues of note: Yesterday she had a growth on her uterus removed and sent for biopsy. She had bleeding and pain that lead them to evaluate. Overall she was doing well with recovery from this but still having pain today at that site. She was reminded of yearly mammograms and encouraged to call with any questions or concerns. Overall she was in good spirits with all she had going on.   Pt reports Yes No Comments  Tamoxifen     Letrozole     Anastrazole     Mammogram  Date:      Dr. Pamelia Hoit on 02-09-23 ASSESSMENT & PLAN:  Ductal carcinoma in situ (DCIS) of right breast 12/03/2022: Right nipple excision: DCIS grade 2 involving intraductal papilloma, anterior margin positive, ER 90%, PR 70% 12/14/2022: Nipple and areola right margin excision: Focal minimal residual intraductal papilloma with squamous metaplasia, no residual DCIS   Recommendation: 1. adjuvant radiation therapy 01/25/2023-02/16/2023 2. Followed by antiestrogen therapy with tamoxifen 5 years I recommended 5 mg dosage based upon 1001 clinical trial   Patient's sister-in-law Johnny Bridge is a patient of mine.  Return to clinic in 3 months for survivorship care plan visit

## 2023-03-15 ENCOUNTER — Telehealth: Payer: Self-pay

## 2023-03-15 ENCOUNTER — Ambulatory Visit
Admission: RE | Admit: 2023-03-15 | Discharge: 2023-03-15 | Disposition: A | Discharge status: Home / Self Care | Payer: Medicare Other | Source: Ambulatory Visit | Attending: Radiation Oncology | Admitting: Radiation Oncology

## 2023-03-15 ENCOUNTER — Encounter: Payer: Self-pay | Admitting: Radiation Oncology

## 2023-03-15 NOTE — Telephone Encounter (Signed)
Pt called for Telephone follow up. Overall pt doing well. Note routed to Dr. Basilio Cairo.

## 2023-03-23 ENCOUNTER — Other Ambulatory Visit (HOSPITAL_COMMUNITY): Payer: Self-pay | Admitting: Obstetrics & Gynecology

## 2023-03-23 DIAGNOSIS — N950 Postmenopausal bleeding: Secondary | ICD-10-CM

## 2023-03-27 ENCOUNTER — Ambulatory Visit (HOSPITAL_COMMUNITY)
Admission: RE | Admit: 2023-03-27 | Discharge: 2023-03-27 | Disposition: A | Discharge status: Home / Self Care | Payer: Medicare Other | Source: Ambulatory Visit | Attending: Obstetrics & Gynecology | Admitting: Obstetrics & Gynecology

## 2023-03-27 DIAGNOSIS — N950 Postmenopausal bleeding: Secondary | ICD-10-CM | POA: Insufficient documentation

## 2023-05-14 ENCOUNTER — Other Ambulatory Visit: Payer: Self-pay

## 2023-05-14 ENCOUNTER — Encounter: Payer: Self-pay | Admitting: Adult Health

## 2023-05-14 ENCOUNTER — Inpatient Hospital Stay: Payer: Medicare Other | Attending: Radiation Oncology | Admitting: Adult Health

## 2023-05-14 VITALS — BP 158/87 | HR 83 | Temp 98.1°F | Resp 18 | Ht 62.0 in | Wt 178.4 lb

## 2023-05-14 DIAGNOSIS — D0511 Intraductal carcinoma in situ of right breast: Secondary | ICD-10-CM | POA: Diagnosis present

## 2023-05-14 DIAGNOSIS — Z8262 Family history of osteoporosis: Secondary | ICD-10-CM | POA: Insufficient documentation

## 2023-05-14 DIAGNOSIS — Z803 Family history of malignant neoplasm of breast: Secondary | ICD-10-CM | POA: Insufficient documentation

## 2023-05-14 DIAGNOSIS — Z79810 Long term (current) use of selective estrogen receptor modulators (SERMs): Secondary | ICD-10-CM | POA: Diagnosis not present

## 2023-05-14 DIAGNOSIS — Z79899 Other long term (current) drug therapy: Secondary | ICD-10-CM | POA: Diagnosis not present

## 2023-05-14 DIAGNOSIS — Z170 Estrogen receptor positive status [ER+]: Secondary | ICD-10-CM | POA: Insufficient documentation

## 2023-05-14 DIAGNOSIS — Z801 Family history of malignant neoplasm of trachea, bronchus and lung: Secondary | ICD-10-CM | POA: Diagnosis not present

## 2023-05-14 DIAGNOSIS — N3281 Overactive bladder: Secondary | ICD-10-CM | POA: Diagnosis not present

## 2023-05-14 MED ORDER — TAMOXIFEN CITRATE 10 MG PO TABS: 5.0000 mg | ORAL_TABLET | Freq: Every day | ORAL | 1 refills | Status: DC

## 2023-05-14 NOTE — Progress Notes (Signed)
SURVIVORSHIP VISIT:  BRIEF ONCOLOGIC HISTORY:  Oncology History  Ductal carcinoma in situ (DCIS) of right breast  11/23/2022 Initial Diagnosis   Breast MRI detected 9 mm retroareolar right breast mass, excision revealed DCIS grade 2 involving intraductal papilloma, anterior margin was positive, resection of the margins on 12/14/2022: No further DCIS and margins were clear.  ER 90%, PR 70%   01/15/2023 Genetic Testing   Negative genetic testing on the CancerNext-Expanded+RNAinsight panel.  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  The report date is January 15, 2023.  The CancerNext-Expanded gene panel offered by [facility] and includes sequencing and rearrangement analysis for the following 77 genes: AIP, ALK, APC*, ATM*, AXIN2, BAP1, BARD1, BLM, BMPR1A, BRCA1*, BRCA2*, BRIP1*, CDC73, CDH1*, CDK4, CDKN1B, CDKN2A, CHEK2*, CTNNA1, DICER1, FANCC, FH, FLCN, GALNT12, KIF1B, LZTR1, MAX, MEN1, MET, MLH1*, MSH2*, MSH3, MSH6*, MUTYH*, NBN, NF1*, NF2, NTHL1, PALB2*, PHOX2B, PMS2*, POT1, PRKAR1A, PTCH1, PTEN*, RAD51C*, RAD51D*, RB1, RECQL, RET, SDHA, SDHAF2, SDHB, SDHC, SDHD, SMAD4, SMARCA4, SMARCB1, SMARCE1, STK11, SUFU, TMEM127, TP53*, TSC1, TSC2, VHL and XRCC2 (sequencing and deletion/duplication); EGFR, EGLN1, HOXB13, KIT, MITF, PDGFRA, POLD1, and POLE (sequencing only); EPCAM and GREM1 (deletion/duplication only). DNA and RNA analyses performed for * genes.    01/24/2023 - 02/16/2023 Radiation Therapy   Plan Name: Breast_R Site: Breast, Right Technique: 3D Mode: Photon Dose Per Fraction: 2.66 Gy Prescribed Dose (Delivered / Prescribed): 42.56 Gy / 42.56 Gy Prescribed Fxs (Delivered / Prescribed): 16 / 16   02/2023 -  Anti-estrogen oral therapy   Tamoxifen x 5 years     INTERVAL HISTORY:  Susan Stevens to review her survivorship care plan detailing her treatment course for breast cancer, as well as monitoring long-term side effects of that treatment, education regarding health maintenance, screening, and  overall wellness and health promotion.     Overall, Susan Stevens reports feeling quite well.  She is taking Tamoxifen daily.  She denies any significant issues with taking this.  She recently underwent uterine fibroid removal with Dr. Karma Greaser.  She tolerated this well.  REVIEW OF SYSTEMS:  Review of Systems  Constitutional:  Negative for appetite change, chills, fatigue, fever and unexpected weight change.  HENT:   Negative for hearing loss, lump/mass and trouble swallowing.   Eyes:  Negative for eye problems and icterus.  Respiratory:  Negative for chest tightness, cough and shortness of breath.   Cardiovascular:  Negative for chest pain, leg swelling and palpitations.  Gastrointestinal:  Negative for abdominal distention, abdominal pain, constipation, diarrhea, nausea and vomiting.  Endocrine: Negative for hot flashes.  Genitourinary:  Negative for difficulty urinating.   Musculoskeletal:  Negative for arthralgias.  Skin:  Negative for itching and rash.  Neurological:  Negative for dizziness, extremity weakness, headaches and numbness.  Hematological:  Negative for adenopathy. Does not bruise/bleed easily.  Psychiatric/Behavioral:  Negative for depression. The patient is not nervous/anxious.   Breast: Denies any new nodularity, masses, tenderness, nipple changes, or nipple discharge.       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   OAB (overactive bladder)    Past Surgical History:  Procedure Laterality Date   BREAST BIOPSY Right 2022   BREAST CYST EXCISION Right 12/14/2022   Procedure: RIGHT NIPPLE AREOLA EXCISION;  Surgeon: Emelia Loron, MD;  Location: [facility];  Service: General;  Laterality: Right;   DILATION AND EVACUATION  MASS EXCISION Right 11/23/2022   Procedure: EXCISION OF RIGHT NIPPLE MASS;  Surgeon: Emelia Loron, MD;  Location: [facility];  Service: General;  Laterality: Right;   MOUTH SURGERY     TUBAL LIGATION       ALLERGIES:  Allergies  Allergen Reactions   Phenothiazines     Muscle rigidity     CURRENT MEDICATIONS:  Outpatient Encounter Medications as of 05/14/2023  Medication Sig   cephALEXin (KEFLEX) 500 MG capsule Take 500 mg by mouth 3 (three) times daily.   fluconazole (DIFLUCAN) 150 MG tablet Take 150 mg by mouth once.   oxybutynin (DITROPAN-XL) 5 MG 24 hr tablet Take 5 mg by mouth at bedtime.   tamoxifen (NOLVADEX) 10 MG tablet Take 0.5 tablets (5 mg total) by mouth 2 (two) times daily.   No facility-administered encounter medications on file as of 05/14/2023.     ONCOLOGIC FAMILY HISTORY:  Family History  Problem Relation Age of Onset   Memory loss Mother    Breast cancer Mother        ?? calcifications   Lung cancer Father    Lung cancer Paternal Grandmother    Lung cancer Paternal Grandfather    Lung cancer Paternal Aunt    Dementia Maternal Aunt    Dementia Maternal Uncle      SOCIAL HISTORY:  Social History   Socioeconomic History   Marital status: Married    Spouse name: Not on file   Number of children: Not on file   Years of education: Not on file   Highest education level: Not on file  Occupational History   Not on file  Tobacco Use   Smoking status: Never   Smokeless tobacco: Never  Vaping Use   Vaping Use: Never used  Substance and Sexual Activity   Alcohol use: Never   Drug use: Never   Sexual activity: Not Currently    Birth control/protection: Post-menopausal  Other Topics Concern   Not on file  Social History Narrative   Not on file   Social Determinants of Health   Financial Resource Strain: Not on file  Food Insecurity: No Food Insecurity (12/12/2022)   Hunger Vital Sign    Worried About Running Out of Food in the Last Year: Never true    Ran Out of Food in the Last Year: Never true  Transportation  Needs: No Transportation Needs (12/12/2022)   PRAPARE - Administrator, Civil Service (Medical): No    Lack of Transportation (Non-Medical): No  Physical Activity: Not on file  Stress: Not on file  Social Connections: Not on file  Intimate Partner Violence: Not At Risk (12/12/2022)   Humiliation, Afraid, Rape, and Kick questionnaire    Fear of Current or Ex-Partner: No    Emotionally Abused: No    Physically Abused: No    Sexually Abused: No     OBSERVATIONS/OBJECTIVE:  BP (!) 158/87 (BP Location: Left Arm, Patient Position: Sitting)   Pulse 83   Temp 98.1 F (36.7 C) (Tympanic)   Resp 18   Ht 5\' 2"  (1.575 m)   Wt 178 lb 6.4 oz (80.9 kg)   SpO2 96%   BMI 32.63 kg/m  GENERAL: Patient is a well appearing female in no acute distress HEENT:  Sclerae anicteric.  Oropharynx clear and moist. No ulcerations or evidence of oropharyngeal candidiasis. Neck is supple.  NODES:  No cervical, supraclavicular, or axillary lymphadenopathy palpated.  BREAST EXAM: Right breast status postlumpectomy  and radiation no sign of local recurrence left breast is benign. LUNGS:  Clear to auscultation bilaterally.  No wheezes or rhonchi. HEART:  Regular rate and rhythm. No murmur appreciated. ABDOMEN:  Soft, nontender.  Positive, normoactive bowel sounds. No organomegaly palpated. MSK:  No focal spinal tenderness to palpation. Full range of motion bilaterally in the upper extremities. EXTREMITIES:  No peripheral edema.   SKIN:  Clear with no obvious rashes or skin changes. No nail dyscrasia. NEURO:  Nonfocal. Well oriented.  Appropriate affect.   LABORATORY DATA:  None for this visit.  DIAGNOSTIC IMAGING:  None for this visit.      ASSESSMENT AND PLAN:  Ms.. Stevens is a pleasant 67 y.o. female with Stage 0 right breast DCIS, ER+/PR+, diagnosed in 10/2022, treated with lumpectomy, adjuvant radiation therapy, and anti-estrogen therapy with Tamoxifen beginning in 02/2023.  She presents to the  Survivorship Clinic for our initial meeting and routine follow-up post-completion of treatment for breast cancer.    1. Stage 0 right breast cancer:  Susan Stevens is continuing to recover from definitive treatment for breast cancer. She will follow-up with her medical oncologist, Dr.  Pamelia Hoit in 09/2023 with history and physical exam per surveillance protocol.  She will continue her anti-estrogen therapy with Tamoxifen. Thus far, she is tolerating the Tamoxifen well, with minimal side effects. Her mammogram is due 08/2023; orders placed today.   Today, a comprehensive survivorship care plan and treatment summary was reviewed with the patient today detailing her breast cancer diagnosis, treatment course, potential late/long-term effects of treatment, appropriate follow-up care with recommendations for the future, and patient education resources.  A copy of this summary, along with a letter will be sent to the patient's primary care provider via mail/fax/In Basket message after today's visit.    2. Bone health: She has a family history of osteoporosis and I discussed that she ask her GYN whether they perform bone density testing at their office when she sees him for her annual visit.  She was given education on specific activities to promote bone health.  3. Cancer screening:  Due to Susan Stevens's history and her age, she should receive screening for skin cancers, colon cancer, and gynecologic cancers.  The information and recommendations are listed on the patient's comprehensive care plan/treatment summary and were reviewed in detail with the patient.    4. Health maintenance and wellness promotion: Susan Stevens was encouraged to consume 5-7 servings of fruits and vegetables per day. We reviewed the "Nutrition Rainbow" handout.  She was also encouraged to engage in moderate to vigorous exercise for 30 minutes per day most days of the week.  She was instructed to limit her alcohol consumption and continue to abstain from  tobacco use.     5. Support services/counseling: It is not uncommon for this period of the patient's cancer care trajectory to be one of many emotions and stressors.   She was given information regarding our available services and encouraged to contact me with any questions or for help enrolling in any of our support group/programs.    Follow up instructions:    -Return to cancer center in 09/2023 for follow-up with Dr. Pamelia Hoit -Mammogram due in 08/2023 -Consider whether MRI is appropriate since cancer was mammographically occult. -She is welcome to return back to the Survivorship Clinic at any time; no additional follow-up needed at this time.  -Consider referral back to survivorship as a long-term survivor for continued surveillance  The patient was provided an opportunity  to ask questions and all were answered. The patient agreed with the plan and demonstrated an understanding of the instructions.   Total encounter time:40 minutes*in face-to-face visit time, chart review, lab review, care coordination, order entry, and documentation of the encounter time.    Susan Anes, NP 05/14/23 11:12 AM Medical Oncology and Hematology [facility] [address] [place], [place] [address] Tel. [phone]    Fax. [phone]  *Total Encounter Time as defined by the Centers for Medicare and Medicaid Services includes, in addition to the face-to-face time of a patient visit (documented in the note above) non-face-to-face time: obtaining and reviewing outside history, ordering and reviewing medications, tests or procedures, care coordination (communications with other health care professionals or caregivers) and documentation in the medical record.

## 2023-05-17 ENCOUNTER — Encounter: Payer: Self-pay | Admitting: Adult Health

## 2023-08-21 ENCOUNTER — Other Ambulatory Visit: Payer: Self-pay | Admitting: Hematology and Oncology

## 2023-08-21 ENCOUNTER — Encounter: Payer: Self-pay | Admitting: Adult Health

## 2023-08-22 ENCOUNTER — Telehealth: Payer: Self-pay

## 2023-08-22 NOTE — Telephone Encounter (Signed)
S/w pt. She is scheduled to see MD 10/02/23 with labs and will need a MM before then. Email sent to Avie Arenas at [facility] to facilitate scheduling pt's MM. Pt is aware and in agreement.

## 2023-08-30 ENCOUNTER — Inpatient Hospital Stay: Payer: Medicare Other | Admitting: Hematology and Oncology

## 2023-08-30 ENCOUNTER — Inpatient Hospital Stay: Payer: Medicare Other

## 2023-09-21 ENCOUNTER — Ambulatory Visit
Admission: RE | Admit: 2023-09-21 | Discharge: 2023-09-21 | Disposition: A | Discharge status: Home / Self Care | Payer: Medicare Other | Source: Ambulatory Visit | Attending: Adult Health | Admitting: Adult Health

## 2023-09-21 DIAGNOSIS — D0511 Intraductal carcinoma in situ of right breast: Secondary | ICD-10-CM

## 2023-10-01 ENCOUNTER — Other Ambulatory Visit: Payer: Self-pay

## 2023-10-01 DIAGNOSIS — D0511 Intraductal carcinoma in situ of right breast: Secondary | ICD-10-CM

## 2023-10-02 ENCOUNTER — Inpatient Hospital Stay: Payer: Medicare Other

## 2023-10-02 ENCOUNTER — Inpatient Hospital Stay: Payer: Medicare Other | Attending: Hematology and Oncology | Admitting: Hematology and Oncology

## 2023-10-02 VITALS — BP 145/87 | HR 94 | Temp 97.5°F | Resp 18 | Ht 62.0 in | Wt 183.7 lb

## 2023-10-02 DIAGNOSIS — E559 Vitamin D deficiency, unspecified: Secondary | ICD-10-CM | POA: Diagnosis not present

## 2023-10-02 DIAGNOSIS — D0511 Intraductal carcinoma in situ of right breast: Secondary | ICD-10-CM | POA: Insufficient documentation

## 2023-10-02 DIAGNOSIS — Z170 Estrogen receptor positive status [ER+]: Secondary | ICD-10-CM | POA: Diagnosis not present

## 2023-10-02 DIAGNOSIS — Z923 Personal history of irradiation: Secondary | ICD-10-CM | POA: Diagnosis not present

## 2023-10-02 DIAGNOSIS — R635 Abnormal weight gain: Secondary | ICD-10-CM | POA: Insufficient documentation

## 2023-10-02 DIAGNOSIS — Z1721 Progesterone receptor positive status: Secondary | ICD-10-CM | POA: Insufficient documentation

## 2023-10-02 DIAGNOSIS — R531 Weakness: Secondary | ICD-10-CM | POA: Insufficient documentation

## 2023-10-02 DIAGNOSIS — Z79810 Long term (current) use of selective estrogen receptor modulators (SERMs): Secondary | ICD-10-CM | POA: Insufficient documentation

## 2023-10-02 DIAGNOSIS — Z79899 Other long term (current) drug therapy: Secondary | ICD-10-CM | POA: Insufficient documentation

## 2023-10-02 LAB — CBC WITH DIFFERENTIAL (CANCER CENTER ONLY)
Abs Immature Granulocytes: 0.03 10*3/uL (ref 0.00–0.07)
Basophils Absolute: 0.1 10*3/uL (ref 0.0–0.1)
Basophils Relative: 1 %
Eosinophils Absolute: 0.1 10*3/uL (ref 0.0–0.5)
Eosinophils Relative: 2 %
HCT: 42.9 % (ref 36.0–46.0)
Hemoglobin: 14.2 g/dL (ref 12.0–15.0)
Immature Granulocytes: 0 %
Lymphocytes Relative: 28 %
Lymphs Abs: 2 10*3/uL (ref 0.7–4.0)
MCH: 29.8 pg (ref 26.0–34.0)
MCHC: 33.1 g/dL (ref 30.0–36.0)
MCV: 90.1 fL (ref 80.0–100.0)
Monocytes Absolute: 0.8 10*3/uL (ref 0.1–1.0)
Monocytes Relative: 11 %
Neutro Abs: 4.4 10*3/uL (ref 1.7–7.7)
Neutrophils Relative %: 58 %
Platelet Count: 256 10*3/uL (ref 150–400)
RBC: 4.76 MIL/uL (ref 3.87–5.11)
RDW: 13.4 % (ref 11.5–15.5)
WBC Count: 7.4 10*3/uL (ref 4.0–10.5)
nRBC: 0 % (ref 0.0–0.2)

## 2023-10-02 LAB — CMP (CANCER CENTER ONLY)
ALT: 21 U/L (ref 0–44)
AST: 18 U/L (ref 15–41)
Albumin: 4 g/dL (ref 3.5–5.0)
Alkaline Phosphatase: 77 U/L (ref 38–126)
Anion gap: 6 (ref 5–15)
BUN: 14 mg/dL (ref 8–23)
CO2: 28 mmol/L (ref 22–32)
Calcium: 9.1 mg/dL (ref 8.9–10.3)
Chloride: 108 mmol/L (ref 98–111)
Creatinine: 0.9 mg/dL (ref 0.44–1.00)
GFR, Estimated: >60 mL/min (ref >60)
Glucose, Bld: 96 mg/dL (ref 70–99)
Potassium: 3.9 mmol/L (ref 3.5–5.1)
Sodium: 142 mmol/L (ref 135–145)
Total Bilirubin: 0.3 mg/dL (ref <1.2)
Total Protein: 6.7 g/dL (ref 6.5–8.1)

## 2023-10-02 MED ORDER — TAMOXIFEN CITRATE 10 MG PO TABS: 10.0000 mg | ORAL_TABLET | Freq: Every day | ORAL | 12 refills | Status: DC

## 2023-10-02 NOTE — Assessment & Plan Note (Addendum)
12/03/2022: Right nipple excision: DCIS grade 2 involving intraductal papilloma, anterior margin positive, ER 90%, PR 70% 12/14/2022: Nipple and areola right margin excision: Focal minimal residual intraductal papilloma with squamous metaplasia, no residual DCIS   Recommendation: 1. adjuvant radiation therapy 01/25/2023-02/16/2023 2. Followed by antiestrogen therapy with tamoxifen 5 years I recommended 5 mg dosage based upon TAM01 clinical trial ----------------------------------------------------------------------------------------------------- Tamoxifen toxicities: Lower extremity weakness and feeling of heaviness in the legs and aches and pains and decreased energy levels I instructed her to stop tamoxifen for a couple of weeks and see how she feels.  She will call us to inform us of it.  If it is tamoxifen related then we can switch her to letrozole.   Breast cancer surveillance: 09/21/2023: Benign breast density category B   Patient's sister-in-law Johnny Bridge is a patient of mine.  Return to clinic in 1 year for follow-up

## 2023-10-02 NOTE — Progress Notes (Signed)
Patient Care Team: Pcp, No as PCP - General Serena Croissant, MD as Consulting Physician (Hematology and Oncology) Lonie Peak, MD as Attending Physician (Radiation Oncology) Emelia Loron, MD as Consulting Physician (General Surgery)  DIAGNOSIS:  Encounter Diagnosis  Name Primary?   Ductal carcinoma in situ (DCIS) of right breast Yes    SUMMARY OF ONCOLOGIC HISTORY: Oncology History  Ductal carcinoma in situ (DCIS) of right breast  11/23/2022 Initial Diagnosis   Breast MRI detected 9 mm retroareolar right breast mass, excision revealed DCIS grade 2 involving intraductal papilloma, anterior margin was positive, resection of the margins on 12/14/2022: No further DCIS and margins were clear.  ER 90%, PR 70%   11/23/2022 Surgery   Right nipple excision: DCIS grade 2 involving intraductal papilloma, anterior margin positive, ER 90%, PR 70%    11/23/2022 Cancer Staging   Staging form: Breast, AJCC 8th Edition - Pathologic stage from 11/23/2022: Stage 0 (pTis (DCIS), pN0, cM0, ER+, PR+) - Signed by Loa Socks, NP on 05/14/2023 Stage prefix: Initial diagnosis   12/14/2022 Surgery   Nipple and areola right margin excision: Focal minimal residual intraductal papilloma with squamous metaplasia, no residual DCIS   01/15/2023 Genetic Testing   Negative genetic testing on the CancerNext-Expanded+RNAinsight panel.  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  The report date is January 15, 2023.  The CancerNext-Expanded gene panel offered by [facility] and includes sequencing and rearrangement analysis for the following 77 genes: AIP, ALK, APC*, ATM*, AXIN2, BAP1, BARD1, BLM, BMPR1A, BRCA1*, BRCA2*, BRIP1*, CDC73, CDH1*, CDK4, CDKN1B, CDKN2A, CHEK2*, CTNNA1, DICER1, FANCC, FH, FLCN, GALNT12, KIF1B, LZTR1, MAX, MEN1, MET, MLH1*, MSH2*, MSH3, MSH6*, MUTYH*, NBN, NF1*, NF2, NTHL1, PALB2*, PHOX2B, PMS2*, POT1, PRKAR1A, PTCH1, PTEN*, RAD51C*, RAD51D*, RB1, RECQL, RET, SDHA, SDHAF2, SDHB, SDHC,  SDHD, SMAD4, SMARCA4, SMARCB1, SMARCE1, STK11, SUFU, TMEM127, TP53*, TSC1, TSC2, VHL and XRCC2 (sequencing and deletion/duplication); EGFR, EGLN1, HOXB13, KIT, MITF, PDGFRA, POLD1, and POLE (sequencing only); EPCAM and GREM1 (deletion/duplication only). DNA and RNA analyses performed for * genes.    01/24/2023 - 02/16/2023 Radiation Therapy   Plan Name: Breast_R Site: Breast, Right Technique: 3D Mode: Photon Dose Per Fraction: 2.66 Gy Prescribed Dose (Delivered / Prescribed): 42.56 Gy / 42.56 Gy Prescribed Fxs (Delivered / Prescribed): 16 / 16   02/2023 -  Anti-estrogen oral therapy   Tamoxifen x 5 years     CHIEF COMPLIANT: Follow-up on tamoxifen  HISTORY OF PRESENT ILLNESS:   History of Present Illness   The patient, with a history of DCIS treated with radiation therapy and tamoxifen, presents with increasing leg weakness and heaviness, particularly below the knee, which started about two to three weeks ago. 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 The patient has tried wearing compression hose for the past four to five days without noticeable improvement. The patient also mentions a history of stomach problems and a recent colonoscopy, which was normal. The patient is concerned about the impact of these symptoms on her lifestyle and her ability to plan trips that require walking.         ALLERGIES:  is allergic to phenothiazines.  MEDICATIONS:  Current Outpatient Medications  Medication Sig Dispense Refill   tamoxifen (NOLVADEX) 10 MG tablet Take 1 tablet (10 mg total) by mouth daily. 30 tablet 12   No current facility-administered medications for this visit.  PHYSICAL EXAMINATION: ECOG PERFORMANCE STATUS: 1 - Symptomatic but completely ambulatory  Vitals:   10/02/23 1454  BP: (!) 145/87  Pulse: 94  Resp: 18  Temp: (!)  97.5 F (36.4 C)  SpO2: 99%   Filed Weights   10/02/23 1454  Weight: 183 lb 11.2 oz (83.3 kg)      LABORATORY DATA:  I have reviewed the data as listed    Latest Ref Rng & Units 10/02/2023    2:26 PM  CMP  Glucose 70 - 99 mg/dL 96   BUN 8 - 23 mg/dL 14   Creatinine 6.21 - 1.00 mg/dL 3.08   Sodium 657 - 846 mmol/L 142   Potassium 3.5 - 5.1 mmol/L 3.9   Chloride 98 - 111 mmol/L 108   CO2 22 - 32 mmol/L 28   Calcium 8.9 - 10.3 mg/dL 9.1   Total Protein 6.5 - 8.1 g/dL 6.7   Total Bilirubin <9.6 mg/dL 0.3   Alkaline Phos 38 - 126 U/L 77   AST 15 - 41 U/L 18   ALT 0 - 44 U/L 21     Lab Results  Component Value Date   WBC 7.4 10/02/2023   HGB 14.2 10/02/2023   HCT 42.9 10/02/2023   MCV 90.1 10/02/2023   PLT 256 10/02/2023   NEUTROABS 4.4 10/02/2023    ASSESSMENT & PLAN:  Ductal carcinoma in situ (DCIS) of right breast 12/03/2022: Right nipple excision: DCIS grade 2 involving intraductal papilloma, anterior margin positive, ER 90%, PR 70% 12/14/2022: Nipple and areola right margin excision: Focal minimal residual intraductal papilloma with squamous metaplasia, no residual DCIS   Recommendation: 1. adjuvant radiation therapy 01/25/2023-02/16/2023 2. Followed by antiestrogen therapy with tamoxifen 5 years I recommended 5 mg dosage based upon TAM01 clinical trial ----------------------------------------------------------------------------------------------------- Tamoxifen toxicities: Lower extremity weakness and feeling of heaviness in the legs and aches and pains and decreased energy levels I instructed her to stop tamoxifen for a couple of weeks and see how she feels.  She will call us to inform us of it.  If it is tamoxifen related then we can switch her to letrozole.   Breast cancer surveillance: 09/21/2023: Benign breast density category B   Patient's sister-in-law Johnny Bridge is a patient of mine.  Return to clinic in 1 year for  follow-up ------------------------------------- Assessment and Plan    Breast Cancer on Tamoxifen Patient reports leg heaviness and pain, particularly below the knee, which started 2-3 weeks ago. No other medications taken. Discussed the possibility of Tamoxifen causing these symptoms. -Temporary cessation of Tamoxifen for two weeks to assess if symptoms improve. -Consider switching to Letrozole or Anastrozole if symptoms are indeed due to Tamoxifen.  Weight Gain Patient acknowledges weight gain and expresses desire to lose weight. -Encouraged gradual weight loss, starting with a goal of 5 lbs.  Vitamin D Deficiency Discussed the possibility of Vitamin D deficiency contributing to patient's symptoms. -Encouraged patient to start taking over-the-counter Vitamin D supplements.  Follow-up Plan to follow-up in one year, or sooner if patient's symptoms do not improve after stopping Tamoxifen.          No orders of the defined types were placed in this encounter.  The patient has a good understanding of the overall plan. she agrees with it. she will call with any problems that may develop before the next visit here. Total time spent: 30 mins including face to face time and time spent for planning, charting and co-ordination of care   Viinay K  Pamelia Hoit, MD 10/02/23

## 2024-05-01 ENCOUNTER — Encounter (INDEPENDENT_AMBULATORY_CARE_PROVIDER_SITE_OTHER): Payer: Self-pay | Admitting: Otolaryngology

## 2024-05-01 ENCOUNTER — Ambulatory Visit (INDEPENDENT_AMBULATORY_CARE_PROVIDER_SITE_OTHER): Admitting: Otolaryngology

## 2024-05-01 VITALS — BP 157/94 | HR 92

## 2024-05-01 DIAGNOSIS — J342 Deviated nasal septum: Secondary | ICD-10-CM

## 2024-05-01 DIAGNOSIS — J343 Hypertrophy of nasal turbinates: Secondary | ICD-10-CM

## 2024-05-01 DIAGNOSIS — J3489 Other specified disorders of nose and nasal sinuses: Secondary | ICD-10-CM

## 2024-05-01 DIAGNOSIS — R0982 Postnasal drip: Secondary | ICD-10-CM

## 2024-05-01 DIAGNOSIS — R0981 Nasal congestion: Secondary | ICD-10-CM

## 2024-05-01 DIAGNOSIS — J3089 Other allergic rhinitis: Secondary | ICD-10-CM

## 2024-05-01 MED ORDER — FLUTICASONE PROPIONATE 50 MCG/ACT NA SUSP: 2.0000 | Freq: Every day | NASAL | 6 refills | Status: AC

## 2024-05-01 MED ORDER — LEVOCETIRIZINE DIHYDROCHLORIDE 5 MG PO TABS: 5.0000 mg | ORAL_TABLET | Freq: Every evening | ORAL | 3 refills | Status: AC

## 2024-05-01 NOTE — Patient Instructions (Signed)
 Lloyd Huger Med Nasal Saline Rinse   - start nasal saline rinses with NeilMed Bottle available over the counter or online to help with nasal congestion

## 2024-05-01 NOTE — Progress Notes (Signed)
 ENT CONSULT:  Reason for Consult: chronic nasal obstruction and trouble breathing through her nose    HPI: Discussed the use of AI scribe software for clinical note transcription with the patient, who gave verbal consent to proceed.  History of Present Illness   Discussed the use of AI scribe software for clinical note transcription with the patient, who gave verbal consent to proceed.  History of Present Illness   Susan Stevens is a 68 year old female with chronic nasal congestion who presents with difficulty breathing through her nose.  She has experienced difficulty breathing through her nose for most of her life. As a child, she took a medication for nasal congestion and more recently, she has used Mucinex D, although it was not effective. She has visited urgent care twice recently, where she was given antibiotics, which seemed to help. Her last antibiotic course was a couple of months ago.  She experiences a significantly reduced sense of smell and has not been diagnosed with sinus infections, although she suspects she may have them. She was tested for allergies as a teenager and is allergic to dogs, dust, and bugs. 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 Her symptoms worsen when she gets sick, often starting with a scratchy throat or nagging cough, leading to difficulty breathing through her nose overnight. 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  Her past medical history includes breast cancer, for which she is currently taking tamoxifen  and will continue for four more years. She has a history of ear infections throughout her life and had tubes placed at age 89. She has tried various nasal sprays without significant relief and is not currently on any medication for her nasal symptoms.        Records Reviewed:  Office visit  with Onc 10/02/23 The patient, with a history of DCIS treated with radiation therapy and tamoxifen , presents with increasing leg weakness and heaviness, particularly below the knee, which started about two to three weeks ago. 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 The patient has tried wearing compression hose for the past four to five days without noticeable improvement. The patient also mentions a history of stomach problems and a recent colonoscopy, which was normal. The patient is concerned about the impact of these symptoms on her lifestyle and her ability to plan trips that require walking.        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   OAB (overactive bladder)     Past Surgical History:  Procedure Laterality Date   BREAST BIOPSY Right 2022   BREAST CYST EXCISION Right 12/14/2022   Procedure: RIGHT NIPPLE AREOLA EXCISION;  Surgeon: Enid Harry, MD;  Location: [facility];  Service: General;  Laterality: Right;   DILATION AND EVACUATION     MASS EXCISION Right 11/23/2022   Procedure: EXCISION OF RIGHT NIPPLE MASS;  Surgeon: Enid Harry, MD;  Location: [facility];  Service: General;  Laterality: Right;   MOUTH SURGERY     TUBAL LIGATION      Family History  Problem Relation Age  of Onset   Memory loss Mother    Breast cancer Mother        ?? calcifications   Lung cancer Father    Lung cancer Paternal Grandmother    Lung cancer Paternal Grandfather    Lung cancer Paternal Aunt    Dementia Maternal Aunt    Dementia Maternal Uncle     Social History:  reports that she has never smoked. She has never used smokeless tobacco. She reports that she does not drink alcohol and does not  use drugs.  Allergies:  Allergies  Allergen Reactions   Phenothiazines     Muscle rigidity    Medications: I have reviewed the patient's current medications.  The PMH, PSH, Medications, Allergies, and SH were reviewed and updated.  ROS: Constitutional: Negative for fever, weight loss and weight gain. Cardiovascular: Negative for chest pain and dyspnea on exertion. Respiratory: Is not experiencing shortness of breath at rest. Gastrointestinal: Negative for nausea and vomiting. Neurological: Negative for headaches. Psychiatric: The patient is not nervous/anxious  Blood pressure (!) 157/94, pulse 92, SpO2 96%. There is no height or weight on file to calculate BMI.  PHYSICAL EXAM:  Exam: General: Well-developed, well-nourished Communication and Voice: Clear pitch and clarity Respiratory Respiratory effort: Equal inspiration and expiration without stridor Cardiovascular Peripheral Vascular: Warm extremities with equal color/perfusion Eyes: No nystagmus with equal extraocular motion bilaterally Neuro/Psych/Balance: Patient oriented to person, place, and time; Appropriate mood and affect; Gait is intact with no imbalance; Cranial nerves I-XII are intact Head and Face Inspection: Normocephalic and atraumatic without mass or lesion Palpation: Facial skeleton intact without bony stepoffs Salivary Glands: No mass or tenderness Facial Strength: Facial motility symmetric and full bilaterally ENT Pinna: External ear intact and fully developed External canal: Canal is patent with intact skin Tympanic Membrane: Clear and mobile External Nose: No scar or anatomic deformity Internal Nose: Septum is relatively straight. No polyp, or purulence. Mucosal edema and erythema present.  Bilateral inferior turbinate hypertrophy.  Lips, Teeth, and gums: Mucosa and teeth intact and viable TMJ: No pain to palpation with full mobility Oral cavity/oropharynx: No erythema or exudate, no lesions  present Nasopharynx: No mass or lesion with intact mucosa Hypopharynx: Intact mucosa without pooling of secretions Neck Neck and Trachea: Midline trachea without mass or lesion Thyroid: No mass or nodularity Lymphatics: No lymphadenopathy  Procedure:   PROCEDURE NOTE: nasal endoscopy  Preoperative diagnosis: chronic nasal obstruction symptoms  Postoperative diagnosis: same  Procedure: Diagnostic nasal endoscopy (96295)  Surgeon: Artice Last, M.D.  Anesthesia: Topical lidocaine  and Afrin  H&P REVIEW: The patient's history and physical were reviewed today prior to procedure. All medications were reviewed and updated as well. Complications: None Condition is stable throughout exam Indications and consent: The patient presents with symptoms of chronic sinusitis not responding to previous therapies. All the risks, benefits, and potential complications were reviewed with the patient preoperatively and informed consent was obtained. The time out was completed with confirmation of the correct procedure.   Procedure: The patient was seated upright in the clinic. Topical lidocaine  and Afrin were applied to the nasal cavity. After adequate anesthesia had occurred, the rigid nasal endoscope was passed into the nasal cavity. The nasal mucosa, turbinates, septum, and sinus drainage pathways were visualized bilaterally. This revealed no purulence or significant secretions that might be cultured. There were no polyps or sites of significant inflammation. The mucosa was intact and there was no crusting present. The scope was then slowly withdrawn and the patient tolerated  the procedure well. There were no complications or blood loss.    Assessment/Plan: Encounter Diagnoses  Name Primary?   Nasal obstruction Yes   Environmental and seasonal allergies    Chronic nasal congestion    Post-nasal drip    Hypertrophy of both inferior nasal turbinates    Nasal septal deviation     Assessment and  Plan Assessment & Plan   Assessment and Plan    Chronic nasal congestion and Environmental allergies Chronic nasal congestion and anosmia. Prefers medication management. Nasal endoscopy with patent nasal passages and evidence of mucosal edema, clear secretions but no purulence or polyps - Start Xyzal for allergy management. - Use Flonase nasal spray up to twice a day after nasal irrigation. - Perform nasal irrigation with saline using NetiPod or NeilMed bottle once daily. - Consider allergy testing and immunotherapy if symptoms persist.  Thank you for allowing me to participate in the care of this patient. Please do not hesitate to contact me with any questions or concerns.   Artice Last, MD Otolaryngology [facility] Phone: [phone] Fax: [phone]    05/01/2024, 9:47 AM

## 2024-07-23 ENCOUNTER — Other Ambulatory Visit: Payer: Self-pay | Admitting: Hematology and Oncology

## 2024-07-23 DIAGNOSIS — Z9889 Other specified postprocedural states: Secondary | ICD-10-CM

## 2024-09-22 ENCOUNTER — Ambulatory Visit
Admission: RE | Admit: 2024-09-22 | Discharge: 2024-09-22 | Disposition: A | Source: Ambulatory Visit | Attending: Hematology and Oncology

## 2024-09-22 DIAGNOSIS — Z9889 Other specified postprocedural states: Secondary | ICD-10-CM

## 2024-09-23 ENCOUNTER — Encounter: Payer: Self-pay | Admitting: Hematology and Oncology

## 2024-09-30 NOTE — Assessment & Plan Note (Signed)
 12/03/2022: Right nipple excision: DCIS grade 2 involving intraductal papilloma, anterior margin positive, ER 90%, PR 70% 12/14/2022: Nipple and areola right margin excision: Focal minimal residual intraductal papilloma with squamous metaplasia, no residual DCIS   Recommendation: 1. adjuvant radiation therapy 01/25/2023-02/16/2023 2. Followed by antiestrogen therapy with tamoxifen  5 years I recommended 5 mg dosage based upon TAM01 clinical trial ----------------------------------------------------------------------------------------------------- Tamoxifen  toxicities: Lower extremity weakness and feeling of heaviness in the legs and aches and pains and decreased energy levels I instructed her to stop tamoxifen  for a couple of weeks and see how she feels.  She will call us  to inform us  of it.  If it is tamoxifen  related then we can switch her to letrozole.     Breast cancer surveillance:  Mammogram 09/22/2024: Benign breast density category B Breast exam 10/01/2024: Benign   Patient's sister-in-law [place] is a patient of mine.  Return to clinic in 1 year for follow-up

## 2024-10-01 ENCOUNTER — Inpatient Hospital Stay: Payer: Medicare Other | Attending: Hematology and Oncology | Admitting: Hematology and Oncology

## 2024-10-01 VITALS — BP 134/76 | HR 112 | Temp 97.8°F | Resp 18 | Ht 62.0 in | Wt 184.9 lb

## 2024-10-01 DIAGNOSIS — R531 Weakness: Secondary | ICD-10-CM | POA: Insufficient documentation

## 2024-10-01 DIAGNOSIS — R5383 Other fatigue: Secondary | ICD-10-CM | POA: Insufficient documentation

## 2024-10-01 DIAGNOSIS — R635 Abnormal weight gain: Secondary | ICD-10-CM | POA: Diagnosis not present

## 2024-10-01 DIAGNOSIS — Z79810 Long term (current) use of selective estrogen receptor modulators (SERMs): Secondary | ICD-10-CM | POA: Insufficient documentation

## 2024-10-01 DIAGNOSIS — Z79899 Other long term (current) drug therapy: Secondary | ICD-10-CM | POA: Diagnosis not present

## 2024-10-01 DIAGNOSIS — D0511 Intraductal carcinoma in situ of right breast: Secondary | ICD-10-CM | POA: Diagnosis not present

## 2024-10-01 DIAGNOSIS — Z1721 Progesterone receptor positive status: Secondary | ICD-10-CM | POA: Diagnosis not present

## 2024-10-01 DIAGNOSIS — Z170 Estrogen receptor positive status [ER+]: Secondary | ICD-10-CM | POA: Diagnosis not present

## 2024-10-01 DIAGNOSIS — Z7982 Long term (current) use of aspirin: Secondary | ICD-10-CM | POA: Insufficient documentation

## 2024-10-01 MED ORDER — TAMOXIFEN CITRATE 10 MG PO TABS: 5.0000 mg | ORAL_TABLET | Freq: Every day | ORAL | Status: DC

## 2024-10-01 NOTE — Progress Notes (Signed)
 Patient Care Team: Pcp, No as PCP - General Odean Potts, MD as Consulting Physician (Hematology and Oncology) Izell Domino, MD as Attending Physician (Radiation Oncology) Ebbie Cough, MD as Consulting Physician (General Surgery)  DIAGNOSIS:  Encounter Diagnosis  Name Primary?   Ductal carcinoma in situ (DCIS) of right breast Yes    SUMMARY OF ONCOLOGIC HISTORY: Oncology History  Ductal carcinoma in situ (DCIS) of right breast  11/23/2022 Initial Diagnosis   Breast MRI detected 9 mm retroareolar right breast mass, excision revealed DCIS grade 2 involving intraductal papilloma, anterior margin was positive, resection of the margins on 12/14/2022: No further DCIS and margins were clear.  ER 90%, PR 70%   11/23/2022 Surgery   Right nipple excision: DCIS grade 2 involving intraductal papilloma, anterior margin positive, ER 90%, PR 70%    11/23/2022 Cancer Staging   Staging form: Breast, AJCC 8th Edition - Pathologic stage from 11/23/2022: Stage 0 (pTis (DCIS), pN0, cM0, ER+, PR+) - Signed by Crawford Morna Pickle, NP on 05/14/2023 Stage prefix: Initial diagnosis   12/14/2022 Surgery   Nipple and areola right margin excision: Focal minimal residual intraductal papilloma with squamous metaplasia, no residual DCIS   01/15/2023 Genetic Testing   Negative genetic testing on the CancerNext-Expanded+RNAinsight panel.  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  The report date is January 15, 2023.  The CancerNext-Expanded gene panel offered by [facility] and includes sequencing and rearrangement analysis for the following 77 genes: AIP, ALK, APC*, ATM*, AXIN2, BAP1, BARD1, BLM, BMPR1A, BRCA1*, BRCA2*, BRIP1*, CDC73, CDH1*, CDK4, CDKN1B, CDKN2A, CHEK2*, CTNNA1, DICER1, FANCC, FH, FLCN, GALNT12, KIF1B, LZTR1, MAX, MEN1, MET, MLH1*, MSH2*, MSH3, MSH6*, MUTYH*, NBN, NF1*, NF2, NTHL1, PALB2*, PHOX2B, PMS2*, POT1, PRKAR1A, PTCH1, PTEN*, RAD51C*, RAD51D*, RB1, RECQL, RET, SDHA, SDHAF2, SDHB, SDHC,  SDHD, SMAD4, SMARCA4, SMARCB1, SMARCE1, STK11, SUFU, TMEM127, TP53*, TSC1, TSC2, VHL and XRCC2 (sequencing and deletion/duplication); EGFR, EGLN1, HOXB13, KIT, MITF, PDGFRA, POLD1, and POLE (sequencing only); EPCAM and GREM1 (deletion/duplication only). DNA and RNA analyses performed for * genes.    01/24/2023 - 02/16/2023 Radiation Therapy   Plan Name: Breast_R Site: Breast, Right Technique: 3D Mode: Photon Dose Per Fraction: 2.66 Gy Prescribed Dose (Delivered / Prescribed): 42.56 Gy / 42.56 Gy Prescribed Fxs (Delivered / Prescribed): 16 / 16   02/2023 -  Anti-estrogen oral therapy   Tamoxifen  x 5 years     CHIEF COMPLIANT: Follow-up on tamoxifen  therapy  HISTORY OF PRESENT ILLNESS:   History of Present Illness Susan Stevens is a 68 year old female with DCIS who presents with fatigue and lack of energy. She was referred by Dr. Lemond for evaluation of fatigue and lack of energy.  She experiences significant fatigue and lack of energy, describing herself as 'just totally' without energy. Despite stopping Tamoxifen  temporarily, there has been no improvement in her energy levels. Simple tasks, such as opening a two-liter bottle, are challenging due to her lack of strength. She is currently taking Tamoxifen  at a dose of 5 mg, which she has been cutting from a 10 mg pill, but it has not alleviated her symptoms.  [place] for [place] and exercise. 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 Recent lab work was reported as 'perfect'. She has been undergoing regular mammograms.     ALLERGIES:  is allergic to phenothiazines.  MEDICATIONS:  Current Outpatient Medications  Medication Sig Dispense Refill   aspirin EC 81 MG tablet Take 81 mg by mouth daily. Swallow whole. (Patient not taking: Reported on  05/01/2024)     fluticasone  (FLONASE ) 50 MCG/ACT nasal spray Place 2 sprays into both nostrils daily. 16 g 6   levocetirizine (XYZAL  ALLERGY 24HR) 5 MG tablet Take 1 tablet (5 mg total) by mouth every evening. 30 tablet 3   tamoxifen  (NOLVADEX ) 10 MG tablet Take 1 tablet (10 mg total) by mouth daily. 30 tablet 12   No current facility-administered medications for this visit.    PHYSICAL EXAMINATION: ECOG PERFORMANCE STATUS: 1 - Symptomatic but completely ambulatory  Vitals:   10/01/24 1027  BP: 134/76  Pulse: (!) 112  Resp: 18  Temp: 97.8 F (36.6 C)  SpO2: 100%   Filed Weights   10/01/24 1027  Weight: 184 lb 14.4 oz (83.9 kg)    Physical Exam Breast exam: No palpable lumps or nodules  (exam performed in the presence of a chaperone)  LABORATORY DATA:  I have reviewed the data as listed    Latest Ref Rng & Units 10/02/2023    2:26 PM  CMP  Glucose 70 - 99 mg/dL 96   BUN 8 - 23 mg/dL 14   Creatinine 9.55 - 1.00 mg/dL 9.09   Sodium 864 - 854 mmol/L 142   Potassium 3.5 - 5.1 mmol/L 3.9   Chloride 98 - 111 mmol/L 108   CO2 22 - 32 mmol/L 28   Calcium 8.9 - 10.3 mg/dL 9.1   Total Protein 6.5 - 8.1 g/dL 6.7   Total Bilirubin <8.7 mg/dL 0.3   Alkaline Phos 38 - 126 U/L 77   AST 15 - 41 U/L 18   ALT 0 - 44 U/L 21     Lab Results  Component Value Date   WBC 7.4 10/02/2023   HGB 14.2 10/02/2023   HCT 42.9 10/02/2023   MCV 90.1 10/02/2023   PLT 256 10/02/2023   NEUTROABS 4.4 10/02/2023    ASSESSMENT & PLAN:  Ductal carcinoma in situ (DCIS) of right breast 12/03/2022: Right nipple excision: DCIS grade 2 involving intraductal papilloma, anterior margin positive, ER 90%, PR 70% 12/14/2022: Nipple and areola right margin excision: Focal minimal residual intraductal papilloma with squamous metaplasia, no residual DCIS   Recommendation: 1. adjuvant radiation therapy 01/25/2023-02/16/2023 2. Followed by antiestrogen therapy with tamoxifen  5 years I recommended 5 mg dosage  based upon TAM01 clinical trial ----------------------------------------------------------------------------------------------------- Tamoxifen  toxicities: Profound generalized weakness I discussed with her that even at 5 mg of tamoxifen  she is experiencing the symptoms.  I encouraged her to discontinue tamoxifen  for 3 months and resume it in January.  This way we will know if it is related to tamoxifen .    Breast cancer surveillance:  Mammogram 09/22/2024: Benign breast density category B Breast exam 10/01/2024: Benign   Patient's sister-in-law [place] is a patient of mine.  Return to clinic in 1 year for follow-up   Assessment & Plan Ductal carcinoma in situ (DCIS) of right breast DCIS well-managed with low recurrence risk. Temporary cessation of tamoxifen  deemed safe. - Stop tamoxifen  for three months during holiday season. - Restart tamoxifen  in January.  Leg weakness and heaviness Leg weakness and heaviness possibly due to low estrogen and testosterone, affecting activity tolerance. - Encouraged joining a gym or finding a trainer to increase physical activity. - Recommended daily exercise to achieve target heart rate of 120 bpm.  Weight gain Weight gain possibly  exacerbated by tamoxifen , affecting energy and activity levels. - Encouraged weight loss through regular exercise and dietary modifications. - Suggested home exercise program if unable to attend a gym.      No orders of the defined types were placed in this encounter.  The patient has a good understanding of the overall plan. she agrees with it. she will call with any problems that may develop before the next visit here.  I personally spent a total of 30 minutes in the care of the patient today including preparing to see the patient, getting/reviewing separately obtained history, performing a medically appropriate exam/evaluation, counseling and educating, placing orders, referring and communicating with other health  care professionals, documenting clinical information in the EHR, independently interpreting results, communicating results, and coordinating care.   Viinay K Marshawn Ninneman, MD 10/01/24

## 2024-10-30 ENCOUNTER — Other Ambulatory Visit: Payer: Self-pay | Admitting: Hematology and Oncology

## 2024-11-02 ENCOUNTER — Other Ambulatory Visit (INDEPENDENT_AMBULATORY_CARE_PROVIDER_SITE_OTHER): Payer: Self-pay | Admitting: Otolaryngology

## 2024-12-03 ENCOUNTER — Other Ambulatory Visit (INDEPENDENT_AMBULATORY_CARE_PROVIDER_SITE_OTHER): Payer: Self-pay | Admitting: Otolaryngology

## 2025-10-01 ENCOUNTER — Inpatient Hospital Stay: Attending: Hematology and Oncology | Admitting: Hematology and Oncology
